# Patient Record
Sex: Female | Born: 1985 | Race: White | Hispanic: No | Smoking: Current every day smoker
Health system: Southern US, Community
[De-identification: ages and names within clinical notes are randomized; demographics above are authoritative.]

## PROBLEM LIST (undated history)

## (undated) DIAGNOSIS — Z789 Other specified health status: Secondary | ICD-10-CM

## (undated) DIAGNOSIS — F319 Bipolar disorder, unspecified: Secondary | ICD-10-CM

## (undated) DIAGNOSIS — F4310 Post-traumatic stress disorder, unspecified: Secondary | ICD-10-CM

## (undated) DIAGNOSIS — R011 Cardiac murmur, unspecified: Secondary | ICD-10-CM

## (undated) DIAGNOSIS — M1990 Unspecified osteoarthritis, unspecified site: Secondary | ICD-10-CM

## (undated) DIAGNOSIS — F419 Anxiety disorder, unspecified: Secondary | ICD-10-CM

## (undated) HISTORY — PX: NO PAST SURGERIES: SHX2092

## (undated) HISTORY — PX: OTHER SURGICAL HISTORY: SHX169

## (undated) HISTORY — DX: Bipolar disorder, unspecified: F31.9

## (undated) HISTORY — DX: Anxiety disorder, unspecified: F41.9

## (undated) HISTORY — PX: LEEP: SHX91

---
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
## 2008-12-24 HISTORY — PX: LEEP: SHX91

## 2010-08-09 ENCOUNTER — Inpatient Hospital Stay (HOSPITAL_COMMUNITY): Admission: RE | Admit: 2010-08-09 | Discharge: 2010-08-11 | Payer: Self-pay | Admitting: Obstetrics and Gynecology

## 2010-12-20 ENCOUNTER — Emergency Department (HOSPITAL_COMMUNITY)
Admission: EM | Admit: 2010-12-20 | Discharge: 2010-12-20 | Payer: Self-pay | Source: Home / Self Care | Admitting: Emergency Medicine

## 2011-03-05 LAB — RAPID STREP SCREEN (MED CTR MEBANE ONLY): Streptococcus, Group A Screen (Direct): NEGATIVE

## 2011-03-09 LAB — CBC
HCT: 39.5 % (ref 36.0–46.0)
Hemoglobin: 13.4 g/dL (ref 12.0–15.0)
MCH: 32.6 pg (ref 26.0–34.0)
MCHC: 34.1 g/dL (ref 30.0–36.0)
MCHC: 34.3 g/dL (ref 30.0–36.0)
MCV: 94.3 fL (ref 78.0–100.0)
MCV: 95.1 fL (ref 78.0–100.0)
RBC: 3.92 MIL/uL (ref 3.87–5.11)
RDW: 13 % (ref 11.5–15.5)
WBC: 12.8 10*3/uL — ABNORMAL HIGH (ref 4.0–10.5)
WBC: 19.7 10*3/uL — ABNORMAL HIGH (ref 4.0–10.5)

## 2013-06-23 LAB — OB RESULTS CONSOLE GC/CHLAMYDIA
CHLAMYDIA, DNA PROBE: NEGATIVE
Gonorrhea: NEGATIVE

## 2013-07-23 LAB — OB RESULTS CONSOLE RUBELLA ANTIBODY, IGM: RUBELLA: IMMUNE

## 2013-07-23 LAB — OB RESULTS CONSOLE RPR: RPR: NONREACTIVE

## 2013-07-23 LAB — OB RESULTS CONSOLE HIV ANTIBODY (ROUTINE TESTING): HIV: NONREACTIVE

## 2013-07-23 LAB — OB RESULTS CONSOLE HEPATITIS B SURFACE ANTIGEN: Hepatitis B Surface Ag: NEGATIVE

## 2013-07-23 LAB — OB RESULTS CONSOLE ABO/RH: RH TYPE: POSITIVE

## 2013-07-23 LAB — OB RESULTS CONSOLE ANTIBODY SCREEN: Antibody Screen: NEGATIVE

## 2013-10-26 ENCOUNTER — Emergency Department (HOSPITAL_COMMUNITY)
Admission: EM | Admit: 2013-10-26 | Discharge: 2013-10-26 | Disposition: A | Discharge status: Home / Self Care | Payer: PRIVATE HEALTH INSURANCE | Attending: Emergency Medicine | Admitting: Emergency Medicine

## 2013-10-26 ENCOUNTER — Encounter (HOSPITAL_COMMUNITY): Payer: Self-pay | Admitting: Emergency Medicine

## 2013-10-26 DIAGNOSIS — M26609 Unspecified temporomandibular joint disorder, unspecified side: Secondary | ICD-10-CM | POA: Insufficient documentation

## 2013-10-26 DIAGNOSIS — M26629 Arthralgia of temporomandibular joint, unspecified side: Secondary | ICD-10-CM

## 2013-10-26 DIAGNOSIS — Z331 Pregnant state, incidental: Secondary | ICD-10-CM | POA: Insufficient documentation

## 2013-10-26 DIAGNOSIS — O99330 Smoking (tobacco) complicating pregnancy, unspecified trimester: Secondary | ICD-10-CM | POA: Insufficient documentation

## 2013-10-26 MED ORDER — CYCLOBENZAPRINE HCL 10 MG PO TABS: 10.0000 mg | ORAL_TABLET | Freq: Three times a day (TID) | ORAL | Status: DC | PRN

## 2013-10-26 MED ORDER — HYDROCODONE-ACETAMINOPHEN 5-325 MG PO TABS: 1.0000 | ORAL_TABLET | Freq: Four times a day (QID) | ORAL | Status: DC | PRN

## 2013-10-26 NOTE — ED Notes (Signed)
Pt presents with c/o jaw pain on the right side  Pt states the actual jaw joint hurts  Pt states pain started about 2300  Pt states she was watching TV when it started

## 2013-10-26 NOTE — ED Provider Notes (Signed)
CSN: [id number]     Arrival date & time 10/26/13  0301 History   First MD Initiated Contact with Patient 10/26/13 0319     Chief Complaint  Patient presents with  . Jaw Pain   (Consider location/radiation/quality/duration/timing/severity/associated sxs/prior Treatment) HPI This is a 27 year old female who is [redacted] weeks pregnant. She is here with right TMJ pain that began about 11 PM yesterday evening. She describes it as feeling like an ice being jammed into her face. She has taken Tylenol #3 and used Orajel without relief. She has no history of TMJ dysfunction. The pain is severe enough to have her in tears.  History reviewed. No pertinent past medical history. History reviewed. No pertinent past surgical history. Family History  Problem Relation Age of Onset  . CAD Mother   . Cancer Other   . Hypertension Other   . Diabetes Other    History  Substance Use Topics  . Smoking status: Current Every Day Smoker  . Smokeless tobacco: Not on file  . Alcohol Use: No   OB History   Grav Para Term Preterm Abortions TAB SAB Ect Mult Living   2              Review of Systems  All other systems reviewed and are negative.    Allergies  Review of patient's allergies indicates no known allergies.  Home Medications  No current outpatient prescriptions on file. BP 137/91  Pulse 98  Temp(Src) 98.6 F (37 C) (Oral)  Resp 18  Ht 5\' 3"  (1.6 m)  Wt 137 lb (62.143 kg)  BMI 24.27 kg/m2  SpO2 98%  LMP 03/29/2013  Physical Exam General: Well-developed, well-nourished female in no acute distress; appearance consistent with age of record HENT: normocephalic; atraumatic; tender right temporomandibular joint with pain on movement of jaw; carious right upper and lower second molar is without tenderness to percussion Eyes: Normal appearance Neck: supple Heart: regular rate and rhythm Lungs: Normal respiratory effort and excursion Abdomen: Gravid Extremities: No deformity; full range of  motion Neurologic: Awake, alert and oriented; motor function intact in all extremities and symmetric; no facial droop Skin: Warm and dry Psychiatric: Tearful    ED Course  Procedures (including critical care time)    MDM  The patient was advised that any drug use in pregnancy is discouraged. We will prescribe a short course of hydrocodone and Flexeril; Flexeril is pregnancy category B. She was advised to followup with the dentist on call if she has no dentist. In the meantime she was advised to consider an over-the-counter bite guard at night.    Hanley Seamen, MD 10/26/13 [phone]

## 2013-11-24 DIAGNOSIS — F419 Anxiety disorder, unspecified: Secondary | ICD-10-CM | POA: Insufficient documentation

## 2013-12-24 NOTE — L&D Delivery Note (Signed)
Patient was C/C/+2 and pushed for 5 minutes with epidural.   NSVD  female infant, Apgars 9,9, weight P.   The patient had one midline second degree episiotomy repaired with 2-0 vicryl R. Fundus was firm. EBL was expected. Placenta was delivered intact. Vagina was clear.  Baby was vigorous and doing skin to skin with mother.  Veronica [name]

## 2014-01-19 LAB — OB RESULTS CONSOLE GBS: GBS: NEGATIVE

## 2014-02-06 ENCOUNTER — Encounter (HOSPITAL_COMMUNITY): Payer: PRIVATE HEALTH INSURANCE | Admitting: Anesthesiology

## 2014-02-06 ENCOUNTER — Inpatient Hospital Stay (HOSPITAL_COMMUNITY): Payer: PRIVATE HEALTH INSURANCE | Admitting: Anesthesiology

## 2014-02-06 ENCOUNTER — Inpatient Hospital Stay (HOSPITAL_COMMUNITY)
Admission: AD | Admit: 2014-02-06 | Discharge: 2014-02-08 | DRG: 775 | Disposition: A | Discharge status: Home / Self Care | Payer: PRIVATE HEALTH INSURANCE | Source: Ambulatory Visit | Attending: Obstetrics and Gynecology | Admitting: Obstetrics and Gynecology

## 2014-02-06 ENCOUNTER — Encounter (HOSPITAL_COMMUNITY): Payer: Self-pay | Admitting: *Deleted

## 2014-02-06 DIAGNOSIS — O4290 Premature rupture of membranes, unspecified as to length of time between rupture and onset of labor, unspecified weeks of gestation: Principal | ICD-10-CM | POA: Diagnosis present

## 2014-02-06 DIAGNOSIS — O99334 Smoking (tobacco) complicating childbirth: Secondary | ICD-10-CM | POA: Diagnosis present

## 2014-02-06 LAB — CBC
HEMATOCRIT: 34.3 % — AB (ref 36.0–46.0)
Hemoglobin: 11.5 g/dL — ABNORMAL LOW (ref 12.0–15.0)
MCH: 28.6 pg (ref 26.0–34.0)
MCHC: 33.5 g/dL (ref 30.0–36.0)
MCV: 85.3 fL (ref 78.0–100.0)
Platelets: 230 10*3/uL (ref 150–400)
RBC: 4.02 MIL/uL (ref 3.87–5.11)
RDW: 14.2 % (ref 11.5–15.5)
WBC: 14.2 10*3/uL — ABNORMAL HIGH (ref 4.0–10.5)

## 2014-02-06 LAB — RPR: RPR Ser Ql: NONREACTIVE

## 2014-02-06 LAB — POCT FERN TEST: POCT Fern Test: POSITIVE

## 2014-02-06 MED ORDER — LACTATED RINGERS IV SOLN: 500.0000 mL | Freq: Once | INTRAVENOUS | Status: DC

## 2014-02-06 MED ORDER — FENTANYL 2.5 MCG/ML BUPIVACAINE 1/10 % EPIDURAL INFUSION (WH - ANES)
INTRAMUSCULAR | Status: AC
  Administered 2014-02-06: 14 mL/h via EPIDURAL
  Filled 2014-02-06: qty 125

## 2014-02-06 MED ORDER — ONDANSETRON HCL 4 MG/2ML IJ SOLN
4.0000 mg | Freq: Four times a day (QID) | INTRAMUSCULAR | Status: DC | PRN
  Administered 2014-02-06: 4 mg via INTRAVENOUS
  Filled 2014-02-06: qty 2

## 2014-02-06 MED ORDER — EPHEDRINE 5 MG/ML INJ
INTRAVENOUS | Status: AC
  Filled 2014-02-06: qty 4

## 2014-02-06 MED ORDER — LACTATED RINGERS IV SOLN
INTRAVENOUS | Status: DC
  Administered 2014-02-06: 12:00:00 via INTRAVENOUS

## 2014-02-06 MED ORDER — IBUPROFEN 600 MG PO TABS
600.0000 mg | ORAL_TABLET | Freq: Four times a day (QID) | ORAL | Status: DC | PRN
  Administered 2014-02-06: 600 mg via ORAL
  Filled 2014-02-06: qty 1

## 2014-02-06 MED ORDER — ONDANSETRON HCL 4 MG PO TABS: 4.0000 mg | ORAL_TABLET | ORAL | Status: DC | PRN

## 2014-02-06 MED ORDER — LACTATED RINGERS IV SOLN
500.0000 mL | INTRAVENOUS | Status: DC | PRN
Start: 2014-02-06 — End: 2014-02-06

## 2014-02-06 MED ORDER — EPHEDRINE 5 MG/ML INJ
10.0000 mg | INTRAVENOUS | Status: DC | PRN
  Filled 2014-02-06: qty 2

## 2014-02-06 MED ORDER — SODIUM CHLORIDE 0.9 % IV SOLN: 250.0000 mL | INTRAVENOUS | Status: DC | PRN

## 2014-02-06 MED ORDER — SODIUM CHLORIDE 0.9 % IJ SOLN
3.0000 mL | Freq: Two times a day (BID) | INTRAMUSCULAR | Status: DC
  Administered 2014-02-07 (×2): 3 mL via INTRAVENOUS

## 2014-02-06 MED ORDER — OXYTOCIN BOLUS FROM INFUSION
500.0000 mL | INTRAVENOUS | Status: DC
Start: 2014-02-06 — End: 2014-02-06

## 2014-02-06 MED ORDER — LANOLIN HYDROUS EX OINT: TOPICAL_OINTMENT | CUTANEOUS | Status: DC | PRN

## 2014-02-06 MED ORDER — PHENYLEPHRINE 40 MCG/ML (10ML) SYRINGE FOR IV PUSH (FOR BLOOD PRESSURE SUPPORT)
80.0000 ug | PREFILLED_SYRINGE | INTRAVENOUS | Status: DC | PRN
  Filled 2014-02-06: qty 2

## 2014-02-06 MED ORDER — LIDOCAINE HCL (PF) 1 % IJ SOLN
30.0000 mL | INTRAMUSCULAR | Status: DC | PRN
  Filled 2014-02-06: qty 30

## 2014-02-06 MED ORDER — NICOTINE 21 MG/24HR TD PT24
21.0000 mg | MEDICATED_PATCH | Freq: Every day | TRANSDERMAL | Status: DC
Start: 2014-02-06 — End: 2014-02-06
  Administered 2014-02-06: 21 mg via TRANSDERMAL
  Filled 2014-02-06 (×2): qty 1

## 2014-02-06 MED ORDER — SIMETHICONE 80 MG PO CHEW: 80.0000 mg | CHEWABLE_TABLET | ORAL | Status: DC | PRN

## 2014-02-06 MED ORDER — ONDANSETRON HCL 4 MG/2ML IJ SOLN: 4.0000 mg | INTRAMUSCULAR | Status: DC | PRN

## 2014-02-06 MED ORDER — BENZOCAINE-MENTHOL 20-0.5 % EX AERO
1.0000 "application " | INHALATION_SPRAY | CUTANEOUS | Status: DC | PRN
  Filled 2014-02-06: qty 56

## 2014-02-06 MED ORDER — OXYTOCIN 40 UNITS IN LACTATED RINGERS INFUSION - SIMPLE MED: 62.5000 mL/h | INTRAVENOUS | Status: DC

## 2014-02-06 MED ORDER — METHYLERGONOVINE MALEATE 0.2 MG/ML IJ SOLN: 0.2000 mg | INTRAMUSCULAR | Status: DC | PRN

## 2014-02-06 MED ORDER — WITCH HAZEL-GLYCERIN EX PADS: 1.0000 "application " | MEDICATED_PAD | CUTANEOUS | Status: DC | PRN

## 2014-02-06 MED ORDER — ACETAMINOPHEN 325 MG PO TABS: 650.0000 mg | ORAL_TABLET | ORAL | Status: DC | PRN

## 2014-02-06 MED ORDER — IBUPROFEN 800 MG PO TABS
800.0000 mg | ORAL_TABLET | Freq: Three times a day (TID) | ORAL | Status: DC
  Administered 2014-02-07 – 2014-02-08 (×4): 800 mg via ORAL
  Filled 2014-02-06 (×4): qty 1

## 2014-02-06 MED ORDER — FLEET ENEMA 7-19 GM/118ML RE ENEM: 1.0000 | ENEMA | RECTAL | Status: DC | PRN

## 2014-02-06 MED ORDER — TERBUTALINE SULFATE 1 MG/ML IJ SOLN: 0.2500 mg | Freq: Once | INTRAMUSCULAR | Status: DC | PRN

## 2014-02-06 MED ORDER — FENTANYL 2.5 MCG/ML BUPIVACAINE 1/10 % EPIDURAL INFUSION (WH - ANES): 14.0000 mL/h | INTRAMUSCULAR | Status: DC | PRN

## 2014-02-06 MED ORDER — OXYCODONE-ACETAMINOPHEN 5-325 MG PO TABS: 1.0000 | ORAL_TABLET | ORAL | Status: DC | PRN

## 2014-02-06 MED ORDER — DIBUCAINE 1 % RE OINT
1.0000 "application " | TOPICAL_OINTMENT | RECTAL | Status: DC | PRN
  Filled 2014-02-06: qty 28

## 2014-02-06 MED ORDER — FERROUS SULFATE 325 (65 FE) MG PO TABS
325.0000 mg | ORAL_TABLET | Freq: Two times a day (BID) | ORAL | Status: DC
  Administered 2014-02-07 – 2014-02-08 (×3): 325 mg via ORAL
  Filled 2014-02-06 (×3): qty 1

## 2014-02-06 MED ORDER — MEASLES, MUMPS & RUBELLA VAC ~~LOC~~ INJ
0.5000 mL | INJECTION | Freq: Once | SUBCUTANEOUS | Status: DC
  Filled 2014-02-06: qty 0.5

## 2014-02-06 MED ORDER — METHYLERGONOVINE MALEATE 0.2 MG PO TABS: 0.2000 mg | ORAL_TABLET | ORAL | Status: DC | PRN

## 2014-02-06 MED ORDER — CITRIC ACID-SODIUM CITRATE 334-500 MG/5ML PO SOLN: 30.0000 mL | ORAL | Status: DC | PRN

## 2014-02-06 MED ORDER — LACTATED RINGERS IV SOLN: INTRAVENOUS | Status: DC

## 2014-02-06 MED ORDER — ZOLPIDEM TARTRATE 5 MG PO TABS: 5.0000 mg | ORAL_TABLET | Freq: Every evening | ORAL | Status: DC | PRN

## 2014-02-06 MED ORDER — OXYTOCIN 40 UNITS IN LACTATED RINGERS INFUSION - SIMPLE MED
1.0000 m[IU]/min | INTRAVENOUS | Status: DC
  Administered 2014-02-06: 2 m[IU]/min via INTRAVENOUS
  Filled 2014-02-06: qty 1000

## 2014-02-06 MED ORDER — LIDOCAINE HCL (PF) 1 % IJ SOLN
INTRAMUSCULAR | Status: DC | PRN
  Administered 2014-02-06 (×4): 4 mL

## 2014-02-06 MED ORDER — DIPHENHYDRAMINE HCL 25 MG PO CAPS: 25.0000 mg | ORAL_CAPSULE | Freq: Four times a day (QID) | ORAL | Status: DC | PRN

## 2014-02-06 MED ORDER — TETANUS-DIPHTH-ACELL PERTUSSIS 5-2.5-18.5 LF-MCG/0.5 IM SUSP: 0.5000 mL | Freq: Once | INTRAMUSCULAR | Status: DC

## 2014-02-06 MED ORDER — MAGNESIUM HYDROXIDE 400 MG/5ML PO SUSP: 30.0000 mL | ORAL | Status: DC | PRN

## 2014-02-06 MED ORDER — SENNOSIDES-DOCUSATE SODIUM 8.6-50 MG PO TABS
2.0000 | ORAL_TABLET | ORAL | Status: DC
  Filled 2014-02-06: qty 2

## 2014-02-06 MED ORDER — DIPHENHYDRAMINE HCL 50 MG/ML IJ SOLN: 12.5000 mg | INTRAMUSCULAR | Status: DC | PRN

## 2014-02-06 MED ORDER — PRENATAL MULTIVITAMIN CH
1.0000 | ORAL_TABLET | Freq: Every day | ORAL | Status: DC
  Administered 2014-02-07: 1 via ORAL
  Filled 2014-02-06: qty 1

## 2014-02-06 MED ORDER — PHENYLEPHRINE 40 MCG/ML (10ML) SYRINGE FOR IV PUSH (FOR BLOOD PRESSURE SUPPORT)
PREFILLED_SYRINGE | INTRAVENOUS | Status: AC
  Filled 2014-02-06: qty 10

## 2014-02-06 MED ORDER — SERTRALINE HCL 50 MG PO TABS
50.0000 mg | ORAL_TABLET | Freq: Every day | ORAL | Status: DC
  Administered 2014-02-07 – 2014-02-08 (×2): 50 mg via ORAL
  Filled 2014-02-06 (×2): qty 1

## 2014-02-06 MED ORDER — SODIUM CHLORIDE 0.9 % IJ SOLN: 3.0000 mL | INTRAMUSCULAR | Status: DC | PRN

## 2014-02-06 NOTE — H&P (Signed)
28 y.o. 5823w0d  G2P1001 comes in c/o SROM at 1100 this am.  Otherwise has good fetal movement and no bleeding.  History reviewed. No pertinent past medical history. History reviewed. No pertinent past surgical history.  OB History  Gravida Para Term Preterm AB SAB TAB Ectopic Multiple Living  2 1 1       1     # Outcome Date GA Lbr Len/2nd Weight Sex Delivery Anes PTL Lv  2 CUR           1 TRM               History   Social History  . Marital Status: Married    Spouse Name: N/A    Number of Children: N/A  . Years of Education: N/A   Occupational History  . Not on file.   Social History Main Topics  . Smoking status: Current Every Day Smoker  . Smokeless tobacco: Not on file  . Alcohol Use: No  . Drug Use: No  . Sexual Activity: Not on file   Other Topics Concern  . Not on file   Social History Narrative  . No narrative on file   Review of patient's allergies indicates no known allergies.    Prenatal Transfer Tool  Maternal Diabetes: No Genetic Screening: Normal Maternal Ultrasounds/Referrals: Normal Fetal Ultrasounds or other Referrals:  None Maternal Substance Abuse:  No Significant Maternal Medications:  Meds include: Zoloft Significant Maternal Lab Results: None  Other PNC: uncomplicated.    Filed Vitals:   02/06/14 1856  BP: 124/77  Pulse: 86  Temp:      Lungs/Cor:  NAD Abdomen:  soft, gravid Ex:  no cords, erythema SVE:  5/C/-2, fluid clear, IUPC placed. FHTs:  120s, good STV, NST R Toco:  q3-5   A/P   Term PROM- pit started earlier.  IUPC placed.  GBS neg.  Arneda Sappington A

## 2014-02-06 NOTE — Anesthesia Procedure Notes (Signed)
Epidural Patient location during procedure: OB Start time: 02/06/2014 6:23 PM  Staffing Performed by: anesthesiologist   Preanesthetic Checklist Completed: patient identified, site marked, surgical consent, pre-op evaluation, timeout performed, IV checked, risks and benefits discussed and monitors and equipment checked  Epidural Patient position: sitting Prep: site prepped and draped and DuraPrep Patient monitoring: continuous pulse ox and blood pressure Approach: midline Injection technique: LOR air  Needle:  Needle type: Tuohy  Needle gauge: 17 G Needle length: 9 cm and 9 Needle insertion depth: 5 cm cm Catheter type: closed end flexible Catheter size: 19 Gauge Catheter at skin depth: 10 cm Test dose: negative  Assessment Events: blood not aspirated, injection not painful, no injection resistance, negative IV test and no paresthesia  Additional Notes Discussed risk of headache, infection, bleeding, nerve injury and failed or incomplete block.  Patient voices understanding and wishes to proceed.  Epidural placed easily on first attempt.  No paresthesia.  Patient tolerated procedure well with no apparent complications.  A> Veronica [name]< MDReason for block:procedure for pain

## 2014-02-06 NOTE — Anesthesia Preprocedure Evaluation (Signed)
Anesthesia Evaluation  Patient identified by MRN, date of birth, ID band Patient awake    Reviewed: Allergy & Precautions, H&P , NPO status , Patient's Chart, lab work & pertinent test results, reviewed documented beta blocker date and time   History of Anesthesia Complications Negative for: history of anesthetic complications  Airway Mallampati: I TM Distance: >3 FB Neck ROM: full    Dental  (+) Teeth Intact   Pulmonary neg pulmonary ROS, Current Smoker,  breath sounds clear to auscultation        Cardiovascular negative cardio ROS  Rhythm:regular Rate:Normal     Neuro/Psych negative neurological ROS  negative psych ROS   GI/Hepatic negative GI ROS, Neg liver ROS,   Endo/Other  negative endocrine ROS  Renal/GU negative Renal ROS     Musculoskeletal   Abdominal   Peds  Hematology negative hematology ROS (+)   Anesthesia Other Findings   Reproductive/Obstetrics (+) Pregnancy                           Anesthesia Physical Anesthesia Plan  ASA: II  Anesthesia Plan: Epidural   Post-op Pain Management:    Induction:   Airway Management Planned:   Additional Equipment:   Intra-op Plan:   Post-operative Plan:   Informed Consent: I have reviewed the patients History and Physical, chart, labs and discussed the procedure including the risks, benefits and alternatives for the proposed anesthesia with the patient or authorized representative who has indicated his/her understanding and acceptance.     Plan Discussed with:   Anesthesia Plan Comments:         Anesthesia Quick Evaluation

## 2014-02-07 LAB — CBC
HEMATOCRIT: 33 % — AB (ref 36.0–46.0)
HEMOGLOBIN: 10.9 g/dL — AB (ref 12.0–15.0)
MCH: 28.3 pg (ref 26.0–34.0)
MCHC: 33 g/dL (ref 30.0–36.0)
MCV: 85.7 fL (ref 78.0–100.0)
Platelets: 209 10*3/uL (ref 150–400)
RBC: 3.85 MIL/uL — AB (ref 3.87–5.11)
RDW: 14.3 % (ref 11.5–15.5)
WBC: 19.1 10*3/uL — ABNORMAL HIGH (ref 4.0–10.5)

## 2014-02-07 LAB — ABO/RH: ABO/RH(D): O POS

## 2014-02-07 NOTE — Anesthesia Postprocedure Evaluation (Signed)
  Anesthesia Post-op Note  Anesthesia Post Note  Patient: Veronica [name]  Procedure(s) Performed: * No procedures listed *  Anesthesia type: Epidural  Patient location: Mother/Baby  Post pain: Pain level controlled  Post assessment: Post-op Vital signs reviewed  Last Vitals:  Filed Vitals:   02/07/14 1220  BP: 122/84  Pulse: 83  Temp: 36.7 C  Resp: 18    Post vital signs: Reviewed  Level of consciousness:alert  Complications: No apparent anesthesia complications

## 2014-02-07 NOTE — Progress Notes (Signed)
Clinical Social Work Department PSYCHOSOCIAL ASSESSMENT - MATERNAL/CHILD 02/07/2014  Patient:  Veronica [name],Veronica [name]  Account Number:  [id number]  Admit Date:  02/06/2014  Childs Name:   Veronica [name]    Clinical Social Worker:  Eluterio Seymour, LCSW   Date/Time:  02/07/2014 01:30 PM  Date Referred:  02/06/2014   Referral source  Central Nursery     Referred reason  Depression/Anxiety   Other referral source:    I:  FAMILY / HOME ENVIRONMENT Child's legal guardian:  PARENT  Guardian - Name Guardian - Age Guardian - Address  Veronica [name],Veronica [name] [address]  [place],  [address]  Veronica [name], Veronica [name]  same as above   Other household support members/support persons Other support:    II  PSYCHOSOCIAL DATA Information Source:    Financial and Community Resources Employment:   Both parents employed   Financial resources:  Private Insurance If Medicaid - County:    School / Grade:   Maternity Care Coordinator / Child Services Coordination / Early Interventions:  Cultural issues impacting care:    III  STRENGTHS Strengths  Supportive family/friends  Home prepared for Child (including basic supplies)  Adequate Resources   Strength comment:    IV  RISK FACTORS AND CURRENT PROBLEMS Current Problem:       V  SOCIAL WORK ASSESSMENT Met with mother who was pleasant and receptive to social work intervention.  She is married and have one other dependent age 3.  Spouse and maternal great grandmother were present during CSW visit.     Both parents are employed.  Mother reports hx of depression and states that she is currently taking zoloft.     She denies any current symptoms of depression.  Discussed signs/symptoms of PP depression.  Mother was very receptive to the information. She denies any hx of substance abuse.  Mother reports extensive family support.   No acute social concerns noted or reported at this time.   Informed her of social work availability.      VI SOCIAL WORK  PLAN Social Work Plan  No Further Intervention Required / No Barriers to Discharge    

## 2014-02-07 NOTE — Progress Notes (Addendum)
Patient is eating, ambulating, voiding.  Pain control is good.  Filed Vitals:   02/06/14 2231 02/06/14 2315 02/07/14 0045 02/07/14 0500  BP: 128/68 118/82 117/76 115/83  Pulse: 92 91 91 91  Temp:  98.1 F (36.7 C) 98 F (36.7 C) 98.1 F (36.7 C)  TempSrc:  Oral Oral Oral  Resp: 18 18 18 16   Height:      Weight:      SpO2:  96% 98% 99%    Fundus firm Perineum without swelling.  Lab Results  Component Value Date   WBC 19.1* 02/07/2014   HGB 10.9* 02/07/2014   HCT 33.0* 02/07/2014   MCV 85.7 02/07/2014   PLT 209 02/07/2014    --/--/O POS (02/14 1215)/RI  A/P Post partum day 1.  Routine care.  Expect d/c routine.   Parents desires circumsision.  All risks, benefits and alternatives discussed with the mother.  Veronica [name] A

## 2014-02-07 NOTE — Lactation Note (Signed)
This note was copied from the chart of Veronica Jannelly Minium. Lactation Consultation Note Initial visit at 16 hours of age.  Mom reports a few good feedings, no documented latch scores yet.  [facility] given and discussed.  Breast assessment done with erect nipples, mom is able to hand express colostrum.  Discussed importance of exculsive breastfeeding to establish milk supply.  Mom reports she hand pumped some and bottle fed to baby, because baby is sleepy after circumcision today (less than16 hours of age).  Discussed feeding cues and STS.  Mom to call for assist as needed.   Patient Name: Veronica Creig HinesChristain [name] NWGNF'AToday's Date: 02/07/2014 Reason for consult: Initial assessment   Maternal Data    Feeding    LATCH Score/Interventions          Comfort (Breast/Nipple): Soft / non-tender     Intervention(s): Breastfeeding basics reviewed     Lactation Tools Discussed/Used Pump Review: Setup, frequency, and cleaning Initiated by:: RN Date initiated:: 02/07/14   Consult Status Consult Status: Follow-up Date: 02/08/14 Follow-up type: In-patient    Veronica [name], Veronica [name] 02/07/2014, 2:05 PM

## 2014-02-08 NOTE — Lactation Note (Signed)
This note was copied from the chart of Veronica [name]. Lactation Consultation Note Follow up consult:  Baby Veronica 4035 hours old and being discharged.  Mother hand expressed good flow of colostrum.  Mother placed baby in football hold STS, baby latched easily, rhythmical sucking, some with stimulation, swallows observed.  Reviewed how to bring baby to breast (not breast to baby) and how to check for deep wide latch.  Mother has abrasions on tip of nipples, using comfort gels.  Reviewed engorgement care and mastitis. Encouraged mother to call if further assistance is needed.   Patient Name: Veronica Creig HinesChristain Manka MWUXL'KToday's Date: 02/08/2014 Reason for consult: Follow-up assessment   Maternal Data    Feeding Feeding Type: Breast Fed  LATCH Score/Interventions Latch: Grasps breast easily, tongue down, lips flanged, rhythmical sucking.  Audible Swallowing: A few with stimulation Intervention(s): Skin to skin Intervention(s): Hand expression  Type of Nipple: Everted at rest and after stimulation  Comfort (Breast/Nipple): Filling, red/small blisters or bruises, mild/mod discomfort  Problem noted: Mild/Moderate discomfort Interventions (Mild/moderate discomfort): Comfort gels  Hold (Positioning): No assistance needed to correctly position infant at breast.  LATCH Score: 8  Lactation Tools Discussed/Used     Consult Status Consult Status: Complete    Hardie PulleyBerkelhammer, Ruth Boschen 02/08/2014, 8:55 AM

## 2014-02-08 NOTE — Discharge Summary (Signed)
Obstetric Discharge Summary Reason for Admission: rupture of membranes Prenatal Procedures: ultrasound Intrapartum Procedures: spontaneous vaginal delivery and episiotomy 2nd degree midline Postpartum Procedures: none Complications-Operative and Postpartum: none Hemoglobin  Date Value Ref Range Status  02/07/2014 10.9* 12.0 - 15.0 g/dL Final     HCT  Date Value Ref Range Status  02/07/2014 33.0* 36.0 - 46.0 % Final    Physical Exam:  General: alert and cooperative Lochia: appropriate Uterine Fundus: firm DVT Evaluation: No evidence of DVT seen on physical exam.  Discharge Diagnoses: Term Pregnancy-delivered  Discharge Information: Date: 02/08/2014 Activity: pelvic rest Diet: routine Medications: PNV and Ibuprofen Condition: stable Instructions: refer to practice specific booklet Discharge to: home Follow-up Information   Follow up with HORVATH,MICHELLE A, MD In 4 weeks.   Specialty:  Obstetrics and Gynecology   Contact information:   [address] [place] [id number] [phone]       Newborn Data: Live born female  Birth Weight: 6 lb 12 oz (3062 g) APGAR: 9, 9  Home with mother.  Philip AspenCALLAHAN, Sherrin Stahle 02/08/2014, 9:21 AM

## 2014-02-11 ENCOUNTER — Encounter (HOSPITAL_COMMUNITY): Payer: Self-pay | Admitting: *Deleted

## 2014-02-11 ENCOUNTER — Inpatient Hospital Stay (HOSPITAL_COMMUNITY)
Admission: AD | Admit: 2014-02-11 | Discharge: 2014-02-11 | Disposition: A | Discharge status: Home / Self Care | Payer: PRIVATE HEALTH INSURANCE | Source: Ambulatory Visit | Attending: Obstetrics and Gynecology | Admitting: Obstetrics and Gynecology

## 2014-02-11 DIAGNOSIS — O99893 Other specified diseases and conditions complicating puerperium: Secondary | ICD-10-CM | POA: Insufficient documentation

## 2014-02-11 DIAGNOSIS — O864 Pyrexia of unknown origin following delivery: Secondary | ICD-10-CM | POA: Insufficient documentation

## 2014-02-11 DIAGNOSIS — F17200 Nicotine dependence, unspecified, uncomplicated: Secondary | ICD-10-CM | POA: Insufficient documentation

## 2014-02-11 DIAGNOSIS — O9989 Other specified diseases and conditions complicating pregnancy, childbirth and the puerperium: Secondary | ICD-10-CM

## 2014-02-11 DIAGNOSIS — R52 Pain, unspecified: Secondary | ICD-10-CM | POA: Insufficient documentation

## 2014-02-11 HISTORY — DX: Other specified health status: Z78.9

## 2014-02-11 LAB — URINALYSIS, ROUTINE W REFLEX MICROSCOPIC
BILIRUBIN URINE: NEGATIVE
GLUCOSE, UA: NEGATIVE mg/dL
Ketones, ur: NEGATIVE mg/dL
NITRITE: NEGATIVE
PH: 6 (ref 5.0–8.0)
Protein, ur: NEGATIVE mg/dL
SPECIFIC GRAVITY, URINE: 1.02 (ref 1.005–1.030)
Urobilinogen, UA: 0.2 mg/dL (ref 0.0–1.0)

## 2014-02-11 LAB — URINE MICROSCOPIC-ADD ON

## 2014-02-11 LAB — CBC
HCT: 40.1 % (ref 36.0–46.0)
Hemoglobin: 13.6 g/dL (ref 12.0–15.0)
MCH: 28.7 pg (ref 26.0–34.0)
MCHC: 33.9 g/dL (ref 30.0–36.0)
MCV: 84.6 fL (ref 78.0–100.0)
Platelets: 296 10*3/uL (ref 150–400)
RBC: 4.74 MIL/uL (ref 3.87–5.11)
RDW: 14.3 % (ref 11.5–15.5)
WBC: 13.5 10*3/uL — AB (ref 4.0–10.5)

## 2014-02-11 MED ORDER — IBUPROFEN 600 MG PO TABS
600.0000 mg | ORAL_TABLET | Freq: Once | ORAL | Status: AC
  Administered 2014-02-11: 600 mg via ORAL
  Filled 2014-02-11: qty 1

## 2014-02-11 NOTE — MAU Note (Signed)
Pt reports she has been achy all day, fever 102. Took some tylenol and MD told her to come in. Vaginal delivery 02/14. Breast feeding without problems.

## 2014-02-11 NOTE — MAU Provider Note (Signed)
History     CSN: [id number]  Arrival date and time: 02/11/14 4098   First Provider Initiated Contact with Patient 02/11/14 2147      Chief Complaint  Patient presents with  . Fever  . Generalized Body Aches   HPI   Ms. Veronica [name] is a 28 y.o. female G2P2002 status post vaginal delivery on 02/06/2014: no complications. Pt developed chills a few days ago and fever today; 102. Pt took ibuprofen at 1200 noon and the fever went down to 99.0. She called the office and they told her to monitor it and she did and it went back up to 101.   Pt is breast feeding; baby is feeding every 2-3 hours, milk has come in. She has never gone more than 3 hours without feeding or pumping. She denies breast pain  She denies abdominal pain Vaginal bleeding is minimal      OB History   Grav Para Term Preterm Abortions TAB SAB Ect Mult Living   2 2 2       2       Past Medical History  Diagnosis Date  . Medical history non-contributory     Past Surgical History  Procedure Laterality Date  . No past surgeries      Family History  Problem Relation Age of Onset  . CAD Mother   . Cancer Other   . Hypertension Other   . Diabetes Other     History  Substance Use Topics  . Smoking status: Current Every Day Smoker  . Smokeless tobacco: Not on file  . Alcohol Use: No    Allergies: No Known Allergies  Prescriptions prior to admission  Medication Sig Dispense Refill  . dibucaine (NUPERCAINAL) 1 % OINT Place 1 application rectally 2 (two) times daily as needed for pain or hemorrhoids.      Marland Kitchen ibuprofen (ADVIL,MOTRIN) 200 MG tablet Take 600 mg by mouth every 6 (six) hours as needed for moderate pain.      . Prenatal Vit-Fe Fumarate-FA (PRENATAL MULTIVITAMIN) TABS tablet Take 1 tablet by mouth daily at 12 noon.      . sertraline (ZOLOFT) 50 MG tablet Take 50 mg by mouth daily.       Results for orders placed during the hospital encounter of 02/11/14 (from the past 48 hour(s))   URINALYSIS, ROUTINE W REFLEX MICROSCOPIC     Status: Abnormal   Collection Time    02/11/14  8:14 PM      Result Value Ref Range   Color, Urine YELLOW  YELLOW   APPearance CLEAR  CLEAR   Specific Gravity, Urine 1.020  1.005 - 1.030   pH 6.0  5.0 - 8.0   Glucose, UA NEGATIVE  NEGATIVE mg/dL   Hgb urine dipstick MODERATE (*) NEGATIVE   Bilirubin Urine NEGATIVE  NEGATIVE   Ketones, ur NEGATIVE  NEGATIVE mg/dL   Protein, ur NEGATIVE  NEGATIVE mg/dL   Urobilinogen, UA 0.2  0.0 - 1.0 mg/dL   Nitrite NEGATIVE  NEGATIVE   Leukocytes, UA SMALL (*) NEGATIVE  URINE MICROSCOPIC-ADD ON     Status: Abnormal   Collection Time    02/11/14  8:14 PM      Result Value Ref Range   Squamous Epithelial / LPF RARE  RARE   WBC, UA 3-6  <3 WBC/hpf   RBC / HPF 0-2  <3 RBC/hpf   Bacteria, UA FEW (*) RARE   Urine-Other MUCOUS PRESENT    CBC  Status: Abnormal   Collection Time    02/11/14  9:32 PM      Result Value Ref Range   WBC 13.5 (*) 4.0 - 10.5 K/uL   RBC 4.74  3.87 - 5.11 MIL/uL   Hemoglobin 13.6  12.0 - 15.0 g/dL   HCT 02.740.1  25.336.0 - 66.446.0 %   MCV 84.6  78.0 - 100.0 fL   MCH 28.7  26.0 - 34.0 pg   MCHC 33.9  30.0 - 36.0 g/dL   RDW 40.314.3  47.411.5 - 25.915.5 %   Platelets 296  150 - 400 K/uL    Review of Systems  Constitutional: Positive for fever and chills.  HENT: Negative for sore throat.   Genitourinary: Negative for dysuria, urgency, frequency, hematuria and flank pain.  Musculoskeletal: Positive for back pain (soreness; no pain ).  Neurological: Positive for headaches.   Physical Exam   Blood pressure 120/70, pulse 94, temperature 100.8 F (38.2 C), temperature source Oral, resp. rate 18, height 5\' 3"  (1.6 m), weight 61.689 kg (136 lb), last menstrual period 03/29/2013, SpO2 96.00%, unknown if currently breastfeeding.  Repeated BP 130/71  Physical Exam  Constitutional: She is oriented to person, place, and time. Vital signs are normal. She appears well-developed and well-nourished.   Non-toxic appearance. She does not have a sickly appearance. She does not appear ill. No distress.  HENT:  Head: Normocephalic.  Eyes: Pupils are equal, round, and reactive to light.  Neck: Neck supple.  Cardiovascular: Normal rate and normal heart sounds.   Respiratory: Effort normal and breath sounds normal. No respiratory distress. She exhibits no mass and no tenderness. Right breast exhibits no skin change and no tenderness. Left breast exhibits no skin change and no tenderness.  No erythema; no sign of mastitis   GI: Soft. Normal appearance. She exhibits no distension and no mass. There is no tenderness. There is no rebound, no guarding and no CVA tenderness.  Neurological: She is alert and oriented to person, place, and time. GCS eye subscore is 4. GCS verbal subscore is 5. GCS motor subscore is 6.  Skin: Skin is warm. She is not diaphoretic.    MAU Course  Procedures None  MDM Consulted with Dr. Claiborne Billingsallahan  CBC; white count at discharge from the hospital was 19.5 Ibuprofen 600 mg PO in MAU  Discussed fever of 102.6 with Dr. Claiborne Billingsallahan; will plan to discharge patient home and have her call the office tomorrow morning if her fever is still high.   Assessment and Plan   A:  1. Postpartum fever     P:  Discharge home Call the office tomorrow if fever persist Ok to take ibuprofen every 6 hours as needed for fever Return to MAU if symptoms worsen   Veronica HansenJennifer Irene Chaela Branscum, NP  02/11/2014, 9:47 PM

## 2014-02-11 NOTE — Discharge Instructions (Signed)
Fever, Adult °A fever is a temperature of 100.4° F (38° C) or above.  °HOME CARE °· Take fever medicine as told by your doctor. Do not  take aspirin for fever if you are younger than 26 years of age. °· If you are given antibiotic medicine, take it as told. Finish the medicine even if you start to feel better. °· Rest. °· Drink enough fluids to keep your pee (urine) clear or pale yellow. Do not drink alcohol. °· Take a bath or shower with room temperature water. Do not use ice water or alcohol sponge baths. °· Wear lightweight, loose clothes. °GET HELP RIGHT AWAY IF:  °· You are short of breath or have trouble breathing. °· You are very weak. °· You are dizzy or you pass out (faint). °· You are very thirsty or are making little or no urine. °· You have new pain. °· You throw up (vomit) or have watery poop (diarrhea). °· You keep throwing up or having watery poop for more than 1 to 2 days. °· You have a stiff neck or light bothers your eyes. °· You have a skin rash. °· You have a fever or problems (symptoms) that last for more than 2 to 3 days. °· You have a fever and your problems quickly get worse. °· You keep throwing up the fluids you drink. °· You do not feel better after 3 days. °· You have new problems. °MAKE SURE YOU:  °· Understand these instructions. °· Will watch your condition. °· Will get help right away if you are not doing well or get worse. °Document Released: 09/18/2008 Document Revised: 03/03/2012 Document Reviewed: 10/11/2011 °[employer]® Patient Information ©2014 [employer]. ° °

## 2014-02-13 LAB — URINE CULTURE
Colony Count: NO GROWTH
Culture: NO GROWTH

## 2014-02-15 NOTE — MAU Provider Note (Signed)
Discussed patient at length with Victorino DikeJennifer.  Per Victorino DikeJennifer, pt appeared quite well despite fever.  She denied any complaints but generalized body aches and fever.  White count had declined since discharge.  Urine was negative and she had no head/neckache.  Her Uterus was non-tender, no signs of cold/flu.  Her only recent issue was a bout of loose stools the prior day.  After watching for some time, and without finding a focal reason for her fever, she was discharged home with precautions and suspected viral illness.  She was told to f/u in office with new symptoms or persistent fever.

## 2014-10-25 ENCOUNTER — Encounter (HOSPITAL_COMMUNITY): Payer: Self-pay | Admitting: *Deleted

## 2016-01-31 ENCOUNTER — Encounter (HOSPITAL_COMMUNITY): Payer: Self-pay | Admitting: Cardiology

## 2016-01-31 ENCOUNTER — Emergency Department (HOSPITAL_COMMUNITY)
Admission: EM | Admit: 2016-01-31 | Discharge: 2016-01-31 | Disposition: A | Discharge status: Home / Self Care | Payer: Self-pay | Attending: Emergency Medicine | Admitting: Emergency Medicine

## 2016-01-31 ENCOUNTER — Emergency Department (HOSPITAL_COMMUNITY): Payer: PRIVATE HEALTH INSURANCE

## 2016-01-31 DIAGNOSIS — F17210 Nicotine dependence, cigarettes, uncomplicated: Secondary | ICD-10-CM | POA: Insufficient documentation

## 2016-01-31 DIAGNOSIS — R509 Fever, unspecified: Secondary | ICD-10-CM | POA: Insufficient documentation

## 2016-01-31 DIAGNOSIS — B9689 Other specified bacterial agents as the cause of diseases classified elsewhere: Secondary | ICD-10-CM

## 2016-01-31 DIAGNOSIS — Z793 Long term (current) use of hormonal contraceptives: Secondary | ICD-10-CM | POA: Insufficient documentation

## 2016-01-31 DIAGNOSIS — Z79899 Other long term (current) drug therapy: Secondary | ICD-10-CM | POA: Insufficient documentation

## 2016-01-31 DIAGNOSIS — N760 Acute vaginitis: Secondary | ICD-10-CM | POA: Insufficient documentation

## 2016-01-31 DIAGNOSIS — R0981 Nasal congestion: Secondary | ICD-10-CM | POA: Insufficient documentation

## 2016-01-31 DIAGNOSIS — Z7951 Long term (current) use of inhaled steroids: Secondary | ICD-10-CM | POA: Insufficient documentation

## 2016-01-31 DIAGNOSIS — J029 Acute pharyngitis, unspecified: Secondary | ICD-10-CM | POA: Insufficient documentation

## 2016-01-31 DIAGNOSIS — R1031 Right lower quadrant pain: Secondary | ICD-10-CM

## 2016-01-31 DIAGNOSIS — Z3202 Encounter for pregnancy test, result negative: Secondary | ICD-10-CM | POA: Insufficient documentation

## 2016-01-31 LAB — COMPREHENSIVE METABOLIC PANEL
ALT: 13 U/L — AB (ref 14–54)
ANION GAP: 8 (ref 5–15)
AST: 20 U/L (ref 15–41)
Albumin: 4.3 g/dL (ref 3.5–5.0)
Alkaline Phosphatase: 58 U/L (ref 38–126)
BUN: 5 mg/dL — ABNORMAL LOW (ref 6–20)
CHLORIDE: 104 mmol/L (ref 101–111)
CO2: 26 mmol/L (ref 22–32)
Calcium: 9.2 mg/dL (ref 8.9–10.3)
Creatinine, Ser: 0.67 mg/dL (ref 0.44–1.00)
Glucose, Bld: 108 mg/dL — ABNORMAL HIGH (ref 65–99)
Potassium: 4.1 mmol/L (ref 3.5–5.1)
SODIUM: 138 mmol/L (ref 135–145)
TOTAL PROTEIN: 6.9 g/dL (ref 6.5–8.1)
Total Bilirubin: 0.6 mg/dL (ref 0.3–1.2)

## 2016-01-31 LAB — CBC
HEMATOCRIT: 41.2 % (ref 36.0–46.0)
HEMOGLOBIN: 13.9 g/dL (ref 12.0–15.0)
MCH: 30.8 pg (ref 26.0–34.0)
MCHC: 33.7 g/dL (ref 30.0–36.0)
MCV: 91.2 fL (ref 78.0–100.0)
Platelets: 227 10*3/uL (ref 150–400)
RBC: 4.52 MIL/uL (ref 3.87–5.11)
RDW: 12.5 % (ref 11.5–15.5)
WBC: 8.2 10*3/uL (ref 4.0–10.5)

## 2016-01-31 LAB — I-STAT BETA HCG BLOOD, ED (MC, WL, AP ONLY): I-stat hCG, quantitative: <5 m[IU]/mL (ref <5)

## 2016-01-31 LAB — WET PREP, GENITAL
SPERM: NONE SEEN
TRICH WET PREP: NONE SEEN
YEAST WET PREP: NONE SEEN

## 2016-01-31 LAB — LIPASE, BLOOD: LIPASE: 26 U/L (ref 11–51)

## 2016-01-31 LAB — URINALYSIS, ROUTINE W REFLEX MICROSCOPIC
GLUCOSE, UA: NEGATIVE mg/dL
Hgb urine dipstick: NEGATIVE
KETONES UR: 15 mg/dL — AB
Leukocytes, UA: NEGATIVE
Nitrite: NEGATIVE
PH: 6.5 (ref 5.0–8.0)
Protein, ur: NEGATIVE mg/dL
Specific Gravity, Urine: 1.022 (ref 1.005–1.030)

## 2016-01-31 MED ORDER — ONDANSETRON HCL 4 MG/2ML IJ SOLN
4.0000 mg | Freq: Once | INTRAMUSCULAR | Status: AC
  Administered 2016-01-31: 4 mg via INTRAVENOUS
  Filled 2016-01-31: qty 2

## 2016-01-31 MED ORDER — METRONIDAZOLE 500 MG PO TABS: 500.0000 mg | ORAL_TABLET | Freq: Two times a day (BID) | ORAL | Status: DC

## 2016-01-31 MED ORDER — ONDANSETRON 4 MG PO TBDP: 4.0000 mg | ORAL_TABLET | Freq: Three times a day (TID) | ORAL | Status: DC | PRN

## 2016-01-31 MED ORDER — SODIUM CHLORIDE 0.9 % IV BOLUS (SEPSIS)
1000.0000 mL | Freq: Once | INTRAVENOUS | Status: AC
  Administered 2016-01-31: 1000 mL via INTRAVENOUS

## 2016-01-31 MED ORDER — IOHEXOL 300 MG/ML  SOLN
80.0000 mL | Freq: Once | INTRAMUSCULAR | Status: AC | PRN
  Administered 2016-01-31: 80 mL via INTRAVENOUS

## 2016-01-31 NOTE — ED Notes (Signed)
Patient transported to CT 

## 2016-01-31 NOTE — Discharge Instructions (Signed)
Take your medications as prescribed. Refrain from doing any vaginal douches or genital washes. Refrain from any intercourse or oral sex until you have completed your antibiotics and her symptoms have improved. You may take 800 mg ibuprofen 3 times a day as needed for pain relief. Call your gynecologist tomorrow to schedule a follow-up appointment within the next week. Return to the emergency department if symptoms worsen or new onset of fever, vomiting, bloody stool, blood in urine, vaginal bleeding, pelvic pain.

## 2016-01-31 NOTE — ED Notes (Signed)
Patient is alert and orientedx4.  Patient was explained discharge instructions and they understood them with no questions.   

## 2016-01-31 NOTE — ED Provider Notes (Signed)
CSN: [id number]     Arrival date & time 01/31/16  1305 History  By signing my name below, I, Tanda Rockers, attest that this documentation has been prepared under the direction and in the presence of Melburn Hake, PA-C. Electronically Signed: Tanda Rockers, ED Scribe. 01/31/2016. 3:16 PM.   Chief Complaint  Patient presents with  . Abdominal Pain   The history is provided by the patient. No language interpreter was used.     HPI Comments: Veronica [name] is a 30 y.o. female who presents to the Emergency Department complaining of gradual onset, constant, dull and aching, RLQ abdominal pain x 2-3 weeks, worsening and becoming sharp 6/10 pain today. The pain is worse with movement. Pt also reports having sudden onset, constant, pressure and burning, epigastric pain that began this morning while eating breakfast which she notes has improved since arrival to the ED. Pt went to UC this morning for her pain and was told to come to the ED to rule out appendicitis. Pt reports that she has been nauseous due to the pain but denies vomiting. She also complains of a nasal congestion, low grade fever of 100.3, and a sore throat that began this morning. Recent sick contact with similar symptoms. Denies chills, cough, shortness of breath, chest pain, wheezing, vomiting, diarrhea, constipation, dysuria, hematuria, vaginal bleeding, vaginal discharge, or any other associated symptoms. LNMP: 01/08/2016. Pt has nexplanon implant and reports that she usually gets periods monthly with the nexplanon. G2P2A0. No previous abdominal surgeries.   Past Medical History  Diagnosis Date  . Medical history non-contributory    Past Surgical History  Procedure Laterality Date  . No past surgeries     Family History  Problem Relation Age of Onset  . CAD Mother   . Cancer Other   . Hypertension Other   . Diabetes Other    Social History  Substance Use Topics  . Smoking status: Current Every Day Smoker    Types:  Cigarettes  . Smokeless tobacco: None  . Alcohol Use: No   OB History    Gravida Para Term Preterm AB TAB SAB Ectopic Multiple Living   2 2 2       2      Review of Systems  Constitutional: Positive for fever. Negative for chills.  HENT: Positive for congestion and sore throat.   Respiratory: Negative for cough, shortness of breath and wheezing.   Cardiovascular: Negative for chest pain.  Gastrointestinal: Positive for nausea and abdominal pain. Negative for vomiting, diarrhea and constipation.  Genitourinary: Negative for dysuria, hematuria, vaginal bleeding and vaginal discharge.   Allergies  Review of patient's allergies indicates no known allergies.  Home Medications   Prior to Admission medications   Medication Sig Start Date End Date Taking? Authorizing Provider  dibucaine (NUPERCAINAL) 1 % OINT Place 1 application rectally 2 (two) times daily as needed for pain or hemorrhoids.   Yes Historical Provider, MD  etonogestrel (NEXPLANON) 68 MG IMPL implant Inject 68 mg into the skin once. 05/04/14   Yes Historical Provider, MD  fluticasone (FLONASE) 50 MCG/ACT nasal spray Place 2 sprays into the nose daily. 12/09/15 12/08/16 Yes Historical Provider, MD  ibuprofen (ADVIL,MOTRIN) 200 MG tablet Take 600 mg by mouth every 6 (six) hours as needed for moderate pain.   Yes Historical Provider, MD  sertraline (ZOLOFT) 50 MG tablet Take 50 mg by mouth daily.   Yes Historical Provider, MD  metroNIDAZOLE (FLAGYL) 500 MG tablet Take 1 tablet (500 mg total)  by mouth 2 (two) times daily. 01/31/16   Barrett Henle, PA-C  ondansetron (ZOFRAN ODT) 4 MG disintegrating tablet Take 1 tablet (4 mg total) by mouth every 8 (eight) hours as needed for nausea or vomiting. 01/31/16   Satira Sark Gervase Colberg, PA-C   BP 116/84 mmHg  Pulse 119  Temp(Src) 99.3 F (37.4 C) (Oral)  Resp 18  Ht  (1.6 m)  Wt 52.164 kg  BMI 20.38 kg/m2  SpO2 97%  LMP 01/08/2016   Physical Exam  Constitutional: She  is oriented to person, place, and time. She appears well-developed and well-nourished. No distress.  HENT:  Head: Normocephalic and atraumatic.  Right Ear: Tympanic membrane normal.  Left Ear: Tympanic membrane normal.  Nose: Rhinorrhea present. Right sinus exhibits maxillary sinus tenderness. Right sinus exhibits no frontal sinus tenderness. Left sinus exhibits maxillary sinus tenderness. Left sinus exhibits no frontal sinus tenderness.  Mouth/Throat: Uvula is midline and mucous membranes are normal. Posterior oropharyngeal erythema present. No oropharyngeal exudate, posterior oropharyngeal edema or tonsillar abscesses.  Eyes: Conjunctivae and EOM are normal. Right eye exhibits no discharge. Left eye exhibits no discharge. No scleral icterus.  Neck: Normal range of motion. Neck supple. No tracheal deviation present.  No cervical lymphadenopathy  Cardiovascular: Normal rate, regular rhythm, normal heart sounds and intact distal pulses.   Pulmonary/Chest: Effort normal and breath sounds normal. No respiratory distress. She has no wheezes. She has no rales. She exhibits no tenderness.  Abdominal: Soft. Bowel sounds are normal. She exhibits no distension and no mass. There is tenderness. There is no rebound and no guarding.  Tenderness to RLQ, periumbilical, and epigastric region  Musculoskeletal: Normal range of motion. She exhibits no edema.  Neurological: She is alert and oriented to person, place, and time.  Skin: Skin is warm and dry.  Psychiatric: She has a normal mood and affect. Her behavior is normal.  Nursing note and vitals reviewed.   ED Course  Procedures (including critical care time)  DIAGNOSTIC STUDIES: Oxygen Saturation is 97% on RA, normal by my interpretation.    COORDINATION OF CARE: 3:11 PM-Discussed treatment plan which includes Ct A/P with pt at bedside and pt agreed to plan.   Labs Review Labs Reviewed  WET PREP, GENITAL - Abnormal; Notable for the following:     Clue Cells Wet Prep HPF POC PRESENT (*)    WBC, Wet Prep HPF POC PRESENT (*)    All other components within normal limits  COMPREHENSIVE METABOLIC PANEL - Abnormal; Notable for the following:    Glucose, Bld 108 (*)    BUN 5 (*)    ALT 13 (*)    All other components within normal limits  URINALYSIS, ROUTINE W REFLEX MICROSCOPIC (NOT AT [facility]) - Abnormal; Notable for the following:    Color, Urine AMBER (*)    Bilirubin Urine SMALL (*)    Ketones, ur 15 (*)    All other components within normal limits  LIPASE, BLOOD  CBC  I-STAT BETA HCG BLOOD, ED ([facility], [facility], [facility] ONLY)  GC/CHLAMYDIA PROBE AMP ([place]) NOT AT [facility]   Pelvic exam: normal external genitalia, vulva, vagina, cervix, uterus and adnexa, VULVA: normal appearing vulva with no masses, tenderness or lesions, VAGINA: normal appearing vagina with normal color and discharge, no lesions, vaginal discharge - clear, frothy and mucoid, WET MOUNT done - results: clue cells, white blood cells, DNA probe for chlamydia and GC obtained, CERVIX: normal appearing cervix without discharge or lesions, UTERUS: uterus is  normal size, shape, consistency and nontender, ADNEXA: normal adnexa in size, nontender and no masses, exam chaperoned by female tech.   Imaging Review Ct Abdomen Pelvis W Contrast  01/31/2016  CLINICAL DATA:  Patient with right lower quadrant abdominal pain for 3 weeks, worsening. EXAM: CT ABDOMEN AND PELVIS WITH CONTRAST TECHNIQUE: Multidetector CT imaging of the abdomen and pelvis was performed using the standard protocol following bolus administration of intravenous contrast. CONTRAST:  80mL OMNIPAQUE IOHEXOL 300 MG/ML  SOLN COMPARISON:  None. FINDINGS: Lower chest: Normal heart size. No consolidative pulmonary opacities. No pleural effusion or pneumothorax. Hepatobiliary: Liver is normal in size and contour. Fatty deposition adjacent to the falciform ligament. Within the medial left hepatic lobe there is a sub cm low-attenuation lesion,  too small to accurately characterize. Gallbladder is decompressed. No intrahepatic or extrahepatic biliary ductal dilatation. Pancreas: Unremarkable Spleen: Unremarkable Adrenals/Urinary Tract: The adrenal glands are normal. The kidneys enhance symmetrically with contrast. No hydronephrosis. Urinary bladder is unremarkable. Stomach/Bowel: No abnormal bowel wall thickening or evidence for bowel obstruction. No free fluid or free intraperitoneal air. The appendix is normal. Vascular/Lymphatic: Normal caliber abdominal aorta. No retroperitoneal lymphadenopathy. Other: Uterus and adnexal structures are unremarkable. Musculoskeletal: No aggressive or acute appearing osseous lesions. IMPRESSION: No acute process within the abdomen or pelvis. Electronically Signed   By: Annia Belt M.D.   On: 01/31/2016 16:15   I have personally reviewed and evaluated these images and lab results as part of my medical decision-making.  Filed Vitals:   01/31/16 1312 01/31/16 1824  BP: 116/84 113/64  Pulse: 119 85  Temp: 99.3 F (37.4 C) 98.4 F (36.9 C)  Resp: 18 16     MDM   Final diagnoses:  Right lower quadrant abdominal pain  BV (bacterial vaginosis)   Patient presents with right lower quadrant pain and associated nausea. She reports having subjective fever, sore throat and nasal congestion. Patient was seen at urgent care prior to arrival and was advised to come to the ED due to to concern for appendicitis. VSS. Exam revealed rhinorrhea, bilateral maxillary sinus tenderness, posterior oropharyngeal erythema, right lower quadrant, periumbilical and mild epigastric tenderness no peritoneal signs. Lungs CTAB.  Pregnancy negative. Labs and urine unremarkable. Pelvic exam revealed clear frothy mucoid discharge in the vaginal vault, no CMT, no adnexal tenderness. Swabs obtained for GC and chlamydia. Wet prep positive for clue cells. CT abdomen revealed no acute process within the abdomen or pelvis. I do not suspect PID  or other acute intra-abdominal infectious etiology at this time. Plan to discharge patient home with Flagyl for BV and discussed symptomatic treatment for viral URI symptoms. Advised patient to follow up with her gynecologist within the next week if her pain continues and discussed need for possible outpatient ultrasound for further evaluation.  Evaluation does not show pathology requring ongoing emergent intervention or admission. Pt is hemodynamically stable and mentating appropriately. Discussed findings/results and plan with patient/guardian, who agrees with plan. All questions answered. Return precautions discussed and outpatient follow up given.    I personally performed the services described in this documentation, which was scribed in my presence. The recorded information has been reviewed and is accurate.      Satira Sark [place], [place] 01/31/16 1832  Jerelyn Scott, MD 02/03/16 [phone]

## 2016-01-31 NOTE — ED Notes (Signed)
Pt reports mid/lower abd pain for about a week. Was sent here from [facility] for further work up for append.

## 2016-01-31 NOTE — ED Notes (Signed)
Family at bedside. 

## 2016-02-01 LAB — GC/CHLAMYDIA PROBE AMP (~~LOC~~) NOT AT ARMC
Chlamydia: NEGATIVE
Neisseria Gonorrhea: NEGATIVE

## 2016-02-21 ENCOUNTER — Other Ambulatory Visit: Payer: Self-pay | Admitting: Obstetrics and Gynecology

## 2016-02-23 LAB — CYTOLOGY - PAP

## 2017-02-26 ENCOUNTER — Other Ambulatory Visit: Payer: Self-pay | Admitting: Obstetrics and Gynecology

## 2017-02-28 LAB — CYTOLOGY - PAP

## 2020-08-02 LAB — HM PAP SMEAR: HM Pap smear: NEGATIVE

## 2020-08-04 LAB — HM PAP SMEAR: HM Pap smear: NEGATIVE

## 2021-10-10 ENCOUNTER — Emergency Department (HOSPITAL_COMMUNITY): Payer: Medicaid Other

## 2021-10-10 ENCOUNTER — Encounter (HOSPITAL_COMMUNITY): Payer: Self-pay

## 2021-10-10 ENCOUNTER — Other Ambulatory Visit: Payer: Self-pay

## 2021-10-10 ENCOUNTER — Emergency Department (HOSPITAL_COMMUNITY)
Admission: EM | Admit: 2021-10-10 | Discharge: 2021-10-10 | Disposition: A | Discharge status: Home / Self Care | Payer: Medicaid Other | Attending: Emergency Medicine | Admitting: Emergency Medicine

## 2021-10-10 DIAGNOSIS — D72829 Elevated white blood cell count, unspecified: Secondary | ICD-10-CM | POA: Diagnosis not present

## 2021-10-10 DIAGNOSIS — N939 Abnormal uterine and vaginal bleeding, unspecified: Secondary | ICD-10-CM | POA: Insufficient documentation

## 2021-10-10 DIAGNOSIS — F17210 Nicotine dependence, cigarettes, uncomplicated: Secondary | ICD-10-CM | POA: Insufficient documentation

## 2021-10-10 DIAGNOSIS — R102 Pelvic and perineal pain: Secondary | ICD-10-CM | POA: Diagnosis not present

## 2021-10-10 LAB — CBC WITH DIFFERENTIAL/PLATELET
Abs Immature Granulocytes: 0.05 10*3/uL (ref 0.00–0.07)
Basophils Absolute: 0.1 10*3/uL (ref 0.0–0.1)
Basophils Relative: 1 %
Eosinophils Absolute: 0.1 10*3/uL (ref 0.0–0.5)
Eosinophils Relative: 0 %
HCT: 45.2 % (ref 36.0–46.0)
Hemoglobin: 14.7 g/dL (ref 12.0–15.0)
Immature Granulocytes: 0 %
Lymphocytes Relative: 27 %
Lymphs Abs: 3.7 10*3/uL (ref 0.7–4.0)
MCH: 30.1 pg (ref 26.0–34.0)
MCHC: 32.5 g/dL (ref 30.0–36.0)
MCV: 92.4 fL (ref 80.0–100.0)
Monocytes Absolute: 0.6 10*3/uL (ref 0.1–1.0)
Monocytes Relative: 5 %
Neutro Abs: 9.3 10*3/uL — ABNORMAL HIGH (ref 1.7–7.7)
Neutrophils Relative %: 67 %
Platelets: 325 10*3/uL (ref 150–400)
RBC: 4.89 MIL/uL (ref 3.87–5.11)
RDW: 12.7 % (ref 11.5–15.5)
WBC: 13.9 10*3/uL — ABNORMAL HIGH (ref 4.0–10.5)
nRBC: 0 % (ref 0.0–0.2)

## 2021-10-10 LAB — WET PREP, GENITAL
Clue Cells Wet Prep HPF POC: NONE SEEN
Sperm: NONE SEEN
Trich, Wet Prep: NONE SEEN
Yeast Wet Prep HPF POC: NONE SEEN

## 2021-10-10 LAB — COMPREHENSIVE METABOLIC PANEL
ALT: 15 U/L (ref 0–44)
AST: 21 U/L (ref 15–41)
Albumin: 4.5 g/dL (ref 3.5–5.0)
Alkaline Phosphatase: 54 U/L (ref 38–126)
Anion gap: 9 (ref 5–15)
BUN: 6 mg/dL (ref 6–20)
CO2: 25 mmol/L (ref 22–32)
Calcium: 8.9 mg/dL (ref 8.9–10.3)
Chloride: 105 mmol/L (ref 98–111)
Creatinine, Ser: 0.73 mg/dL (ref 0.44–1.00)
GFR, Estimated: >60 mL/min (ref >60)
Glucose, Bld: 122 mg/dL — ABNORMAL HIGH (ref 70–99)
Potassium: 3.1 mmol/L — ABNORMAL LOW (ref 3.5–5.1)
Sodium: 139 mmol/L (ref 135–145)
Total Bilirubin: 0.4 mg/dL (ref 0.3–1.2)
Total Protein: 7.7 g/dL (ref 6.5–8.1)

## 2021-10-10 LAB — URINALYSIS, ROUTINE W REFLEX MICROSCOPIC
Bacteria, UA: NONE SEEN
Bilirubin Urine: NEGATIVE
Glucose, UA: NEGATIVE mg/dL
Ketones, ur: NEGATIVE mg/dL
Leukocytes,Ua: NEGATIVE
Nitrite: NEGATIVE
Protein, ur: NEGATIVE mg/dL
Specific Gravity, Urine: 1.002 — ABNORMAL LOW (ref 1.005–1.030)
pH: 7 (ref 5.0–8.0)

## 2021-10-10 LAB — I-STAT BETA HCG BLOOD, ED (MC, WL, AP ONLY): I-stat hCG, quantitative: 5 m[IU]/mL — ABNORMAL HIGH (ref <5)

## 2021-10-10 NOTE — ED Notes (Signed)
Pelvic exam ready at bedside. ?

## 2021-10-10 NOTE — Discharge Instructions (Signed)
Your physical exam and blood work were very reassuring as was your ultrasound.  Please continue with the oral contraceptive pill taper prescribed by your OB/GYN and follow-up with them closely in the outpatient setting.  Return to the ER with any persistent heavy vaginal bleeding, lightheadedness, chest pain, shortness of breath with the passout, you develop any worsening abdominal pain, or any other new severe symptoms.

## 2021-10-10 NOTE — ED Provider Notes (Signed)
[facility]-EMERGENCY DEPT Provider Note   CSN: [id number] Arrival date & time: 10/10/21  1040     History Chief Complaint  Patient presents with   Vaginal Bleeding    Veronica [name] is a 35 y.o. female.  Patient presents today with complaint of vaginal bleeding. She states that same began 10 days ago and has been constant since. She is going through 4-5 menstrual pads per day of dark red blood with clots. She was apparently clinically diagnosed by her OB/GYN with endometriosis, and placed on oral birth control for same. Started this medication at the beginning of September, this is the first menstrual cycle she has gone through since starting the new contraceptive medication. She also endorses that her cycle was a few days late. She is also having pelvic pain, however states that it feels like normal menstrual cramps. Denies nausea, vomiting, diarrhea, constipation. Last bowel movement was earlier today and normal. Is passing normal flatus. Does endorses new sexual partner over the past few months. Denies vaginal discharge, dysuria, hematuria. History of LEEP, G2P2, she is up to date on her pap smears. Patient states that she discussed bleeding with her OB/GYN who recommended birth control taper to control bleeding and then follow-up in the office, however she 'got nervous' and decided to come to the ER for further evaluation.  The history is provided by the patient. No language interpreter was used.  Vaginal Bleeding Associated symptoms: no abdominal pain, no dizziness, no fatigue, no fever and no nausea       Past Medical History:  Diagnosis Date   Medical history non-contributory     Patient Active Problem List   Diagnosis Date Noted   Indication for care in labor or delivery 02/06/2014   Postpartum state 02/06/2014    Past Surgical History:  Procedure Laterality Date   LEEP     NO PAST SURGERIES       OB History     Gravida  2   Para  2    Term  2   Preterm      AB      Living  2      SAB      IAB      Ectopic      Multiple      Live Births  1           Family History  Problem Relation Age of Onset   CAD Mother    Cancer Other    Hypertension Other    Diabetes Other     Social History   Tobacco Use   Smoking status: Every Day    Packs/day: 0.50    Types: Cigarettes   Smokeless tobacco: Never  Vaping Use   Vaping Use: Never used  Substance Use Topics   Alcohol use: No   Drug use: Yes    Home Medications Prior to Admission medications   Medication Sig Start Date End Date Taking? Authorizing Provider  dibucaine (NUPERCAINAL) 1 % OINT Place 1 application rectally 2 (two) times daily as needed for pain or hemorrhoids.    [provider]  etonogestrel (NEXPLANON) 68 MG IMPL implant Inject 68 mg into the skin once. 05/04/14    [provider]  fluticasone (FLONASE) 50 MCG/ACT nasal spray Place 2 sprays into the nose daily. 12/09/15 12/08/16  [provider]  ibuprofen (ADVIL,MOTRIN) 200 MG tablet Take 600 mg by mouth every 6 (six) hours as needed for moderate pain.  [provider]  metroNIDAZOLE (FLAGYL) 500 MG tablet Take 1 tablet (500 mg total) by mouth 2 (two) times daily. 01/31/16   Barrett Henle, PA-C  ondansetron (ZOFRAN ODT) 4 MG disintegrating tablet Take 1 tablet (4 mg total) by mouth every 8 (eight) hours as needed for nausea or vomiting. 01/31/16   Barrett Henle, PA-C  sertraline (ZOLOFT) 50 MG tablet Take 50 mg by mouth daily.    [provider]    Allergies    Patient has no known allergies.  Review of Systems   Review of Systems  Constitutional:  Negative for chills, fatigue and fever.  Gastrointestinal:  Negative for abdominal distention, abdominal pain, constipation, diarrhea, nausea and vomiting.  Genitourinary:  Positive for pelvic pain and vaginal bleeding.  Skin:  Negative for pallor.  Neurological:   Negative for dizziness, tremors, seizures, syncope, facial asymmetry, speech difficulty, weakness, light-headedness, numbness and headaches.  Psychiatric/Behavioral:  Negative for confusion and decreased concentration.   All other systems reviewed and are negative.  Physical Exam Updated Vital Signs BP 129/87   Pulse 83   Temp 98.1 F (36.7 C) (Oral)   Resp 18   Ht 5\' 3"  (1.6 m)   Wt 74.8 kg   LMP 09/30/2021   SpO2 98%   BMI 29.23 kg/m   Physical Exam Vitals and nursing note reviewed. Exam conducted with a chaperone present (Destiny, RN present for pelvic exam).  Constitutional:      General: She is not in acute distress.    Appearance: Normal appearance. She is normal weight. She is not ill-appearing, toxic-appearing or diaphoretic.  HENT:     Head: Normocephalic and atraumatic.  Cardiovascular:     Rate and Rhythm: Normal rate.  Pulmonary:     Effort: Pulmonary effort is normal. No respiratory distress.  Abdominal:     General: Abdomen is flat.     Palpations: Abdomen is soft.     Tenderness: There is abdominal tenderness in the right lower quadrant, suprapubic area and left lower quadrant. There is no right CVA tenderness, left CVA tenderness, guarding or rebound. Negative signs include Murphy's sign, Rovsing's sign, McBurney's sign, psoas sign and obturator sign.     Comments: Mild tenderness noted to bilateral lower quadrants and suprapubic area without guarding, rebound, or other abnormality  Genitourinary:    General: Normal vulva.     Exam position: Lithotomy position.     Comments: Dark red blood present in the vagina with some clots. Cervical os is closed  Some left sided adnexal tenderness noted on bimanual exam, no CMT Musculoskeletal:        General: Normal range of motion.     Cervical back: Normal range of motion.  Skin:    General: Skin is warm and dry.  Neurological:     General: No focal deficit present.     Mental Status: She is alert.  Psychiatric:         Mood and Affect: Mood normal.        Behavior: Behavior normal.    ED Results / Procedures / Treatments   Labs (all labs ordered are listed, but only abnormal results are displayed) Labs Reviewed  WET PREP, GENITAL - Abnormal; Notable for the following components:      Result Value   WBC, Wet Prep HPF POC FEW (*)    All other components within normal limits  CBC WITH DIFFERENTIAL/PLATELET - Abnormal; Notable for the following components:   WBC 13.9 (*)  Neutro Abs 9.3 (*)    All other components within normal limits  COMPREHENSIVE METABOLIC PANEL - Abnormal; Notable for the following components:   Potassium 3.1 (*)    Glucose, Bld 122 (*)    All other components within normal limits  URINALYSIS, ROUTINE W REFLEX MICROSCOPIC - Abnormal; Notable for the following components:   Color, Urine STRAW (*)    Specific Gravity, Urine 1.002 (*)    Hgb urine dipstick SMALL (*)    All other components within normal limits  I-STAT BETA HCG BLOOD, ED ([facility], [facility], [facility] ONLY) - Abnormal; Notable for the following components:   I-stat hCG, quantitative 5.0 (*)    All other components within normal limits  GC/CHLAMYDIA PROBE AMP ([place]) NOT AT [facility]    EKG None  Radiology No results found.  Procedures Procedures   Medications Ordered in ED Medications - No data to display  ED Course  I have reviewed the triage vital signs and the nursing notes.  Pertinent labs & imaging results that were available during my care of the patient were reviewed by me and considered in my medical decision making (see chart for details).    MDM Rules/Calculators/A&P                         Patient presents today with complaint of vaginal bleeding for 10 days. Recently began new birth control, this is her first menstrual cycle after starting this new medication. Spoke with her OB who recommended that she do a taper of her birth control to stop bleeding, however she got nervous and came to ER for  further evaluation. Has not began birth control taper at this time. Patient endorses lower abdominal pain, however states that it feels like normal menstrual cramping pain to her.  Patient also endorses some pelvic pain consistent with her menstrual cramps. She is nontoxic, nonseptic appearing, in no apparent distress. Labs, imaging and vitals reviewed.  Patient does not meet the SIRS or Sepsis criteria.  Patient does not have a surgical abdomin and there are no peritoneal signs.  No indication of appendicitis, bowel obstruction, bowel perforation, cholecystitis, diverticulitis.   Labs collected, moderate leukocytosis at 13.9, other labs unremarkable at this time. No anemia. Vitals unremarkable, patient is hemodynamically stable. Some left sided adnexal tenderness noted on bimanual exam, will order transvaginal US for further evaluation of this. [place] pending at shift change, if normal feel patient will be cleared for discharge to continue with birth control taper recommended by her OB/GYN and follow-up with them for further evaluation and management.  Findings and plan of care discussed with supervising physician Dr. Adela Lank who is in agreement.    Care handoff to Haze Rushing, PA-C at shift change   Final Clinical Impression(s) / ED Diagnoses Final diagnoses:  Left adnexal tenderness    Rx / DC Orders ED Discharge Orders     None        Vear Clock 10/10/21 2100    Melene Plan, DO 10/11/21 [phone]

## 2021-10-10 NOTE — ED Triage Notes (Signed)
Patient c/o lower abdominal pain and heavy vaginal bleeding with clots x 10 days.

## 2021-10-10 NOTE — ED Provider Notes (Signed)
  Physical Exam  BP (!) 148/90 (BP Location: Left Arm)   Pulse 78   Temp 98.1 F (36.7 C) (Oral)   Resp 18   Ht 5\' 3"  (1.6 m)   Wt 74.8 kg   LMP 09/30/2021   SpO2 99%   BMI 29.23 kg/m   Physical Exam  ED Course/Procedures     Procedures  MDM    Care of this patient assumed from preceding ED provider 11/30/2021, PA-C at time of shift change.  Please see her associated note for further insight of patient's ED course.  In brief this is a 35 year old female who presents with 10 days of constant vaginal bleeding.  She has already discussed her case with her OB/GYN whom she follows with for endometriosis; plan is for OCP taper and continued outpatient follow-up.  Patient became nervous today and presented to the ED for evaluation.  Per preceding ED provider, had some right adnexal tenderness on exam.  CBC with mild leukocytosis of 13.9, otherwise unremarkable without anemia.  CMP with mild hypokalemia of 3.1.  UA with hemoglobin otherwise not concerning for infection.  Wet mount unremarkable.  GC/chlamydia pending. At time of shift change she was pending transvaginal ultrasound.    Transvaginal ultrasound revealed normal pelvis without evidence of torsion or adnexal mass bilaterally.  No further work-up warranted in ER at this time.  She may follow-up with her OB/GYN outpatient as scheduled and continue with her prescribed OCP taper.  32 voiced understanding for medical evaluation and treatment plan.  Each of her questions was answered to her expressed satisfaction.  Return precautions were given.  Patient is well-appearing, stable, and appropriate for discharge at this time.  This chart was dictated using voice recognition software, Dragon. Despite the best efforts of this provider to proofread and correct errors, errors may still occur which can change documentation meaning.    Ephriam Knuckles, PA-C 10/10/21 1622    10/12/21, MD 10/17/21 1054

## 2021-10-11 LAB — GC/CHLAMYDIA PROBE AMP (~~LOC~~) NOT AT ARMC
Chlamydia: NEGATIVE
Comment: NEGATIVE
Comment: NORMAL
Neisseria Gonorrhea: NEGATIVE

## 2021-12-29 ENCOUNTER — Other Ambulatory Visit: Payer: Self-pay

## 2021-12-29 ENCOUNTER — Emergency Department (HOSPITAL_BASED_OUTPATIENT_CLINIC_OR_DEPARTMENT_OTHER)
Admission: EM | Admit: 2021-12-29 | Discharge: 2021-12-29 | Disposition: A | Discharge status: Home / Self Care | Payer: 59 | Attending: Emergency Medicine | Admitting: Emergency Medicine

## 2021-12-29 DIAGNOSIS — R000 Tachycardia, unspecified: Secondary | ICD-10-CM | POA: Diagnosis not present

## 2021-12-29 DIAGNOSIS — F17200 Nicotine dependence, unspecified, uncomplicated: Secondary | ICD-10-CM | POA: Diagnosis not present

## 2021-12-29 DIAGNOSIS — K1230 Oral mucositis (ulcerative), unspecified: Secondary | ICD-10-CM | POA: Diagnosis not present

## 2021-12-29 DIAGNOSIS — R6884 Jaw pain: Secondary | ICD-10-CM | POA: Diagnosis not present

## 2021-12-29 DIAGNOSIS — K121 Other forms of stomatitis: Secondary | ICD-10-CM

## 2021-12-29 MED ORDER — LIDOCAINE VISCOUS HCL 2 % MT SOLN: 15.0000 mL | OROMUCOSAL | 1 refills | Status: DC | PRN

## 2021-12-29 MED ORDER — LIDOCAINE VISCOUS HCL 2 % MT SOLN
15.0000 mL | Freq: Once | OROMUCOSAL | Status: AC
  Administered 2021-12-29: 15 mL via OROMUCOSAL
  Filled 2021-12-29: qty 15

## 2021-12-29 NOTE — ED Provider Notes (Signed)
[facility] EMERGENCY DEPT Provider Note   CSN: [id number] Arrival date & time: 12/29/21  1322     History Chief Complaint  Patient presents with   Jaw Pain    Veronica [name] is a 36 y.o. female presents emergency department for evaluation of right jaw pain for the past few days.  She reports she noticed some tongue and mouth sores yesterday that is made it hard to eat due to the pain/burning.  Patient ports she recently started Seroquel a little over a week ago.  Additionally, she reports she has a history of TMJ and was seen in urgent care a few days ago and given Flexeril and a shot of Toradol.  She reports that the Flexeril is mildly helping with her pain and she is feeling better, though she was concerned about her mouth ulcers.  She denies any dental pain, nasal congestion, rhinorrhea, sore throat.  She denies any fevers or chills.  Medical history includes bipolar type II and TMJ.  She denies any surgical history.  Daily medication includes Seroquel.  No known drug allergies.  Daily tobacco user.  HPI     Home Medications Prior to Admission medications   Medication Sig Start Date End Date Taking? Authorizing Provider  dibucaine (NUPERCAINAL) 1 % OINT Place 1 application rectally 2 (two) times daily as needed for pain or hemorrhoids.    [provider]  etonogestrel (NEXPLANON) 68 MG IMPL implant Inject 68 mg into the skin once. 05/04/14    [provider]  fluticasone (FLONASE) 50 MCG/ACT nasal spray Place 2 sprays into the nose daily. 12/09/15 12/08/16  [provider]  ibuprofen (ADVIL,MOTRIN) 200 MG tablet Take 600 mg by mouth every 6 (six) hours as needed for moderate pain.    [provider]  metroNIDAZOLE (FLAGYL) 500 MG tablet Take 1 tablet (500 mg total) by mouth 2 (two) times daily. 01/31/16   Barrett Henle, PA-C  ondansetron (ZOFRAN ODT) 4 MG disintegrating tablet Take 1 tablet (4 mg total) by mouth every 8  (eight) hours as needed for nausea or vomiting. 01/31/16   Barrett Henle, PA-C  Oxcarbazepine (TRILEPTAL) 300 MG tablet Take 300 mg by mouth 2 (two) times daily. 09/21/21   [provider]  sertraline (ZOLOFT) 50 MG tablet Take 50 mg by mouth daily.    [provider]  VRAYLAR 6 MG CAPS Take 6 mg by mouth daily. 09/25/21   [provider]      Allergies    Patient has no known allergies.    Review of Systems   Review of Systems  Constitutional:  Negative for chills and fever.  HENT:  Positive for mouth sores. Negative for congestion, dental problem, ear pain, rhinorrhea and sore throat.        Reports jaw pain  Eyes:  Negative for pain and visual disturbance.  Respiratory:  Negative for cough and shortness of breath.   Cardiovascular:  Negative for chest pain and palpitations.  Gastrointestinal:  Negative for abdominal pain and vomiting.  Genitourinary:  Negative for dysuria and hematuria.  Musculoskeletal:  Negative for arthralgias and back pain.  Skin:  Negative for color change and rash.  Neurological:  Negative for seizures, syncope, weakness and headaches.  All other systems reviewed and are negative.  Physical Exam Updated Vital Signs BP (!) 157/95 (BP Location: Right Arm)    Pulse (!) 130    Temp 99 F (37.2 C)    Resp 16  Ht 5\' 3"  (1.6 m)    Wt 68 kg    LMP 10/21/2021    SpO2 100%    BMI 26.57 kg/m  Physical Exam Vitals and nursing note reviewed.  Constitutional:      General: She is not in acute distress.    Appearance: Normal appearance. She is not toxic-appearing.  HENT:     Head: Normocephalic and atraumatic.     Right Ear: Tympanic membrane, ear canal and external ear normal.     Left Ear: Tympanic membrane, ear canal and external ear normal.     Ears:     Comments: No mastoid tenderness bilaterally    Nose: Nose normal.     Mouth/Throat:     Mouth: Mucous membranes are moist.     Pharynx: No posterior oropharyngeal erythema.      Comments: Since missing multiple teeth and has poor dentition.  No palpable abscess, induration, or fluctuance noted over palpated by me oral cavity.  The patient has multiple ulcerative lesions on the lateral sides of her tongue, tip of her tongue, and on the right upper back gum area.  No thrush seen.  Patient is tender to touch anteriorly to her right ear going down into the angle of her mandible.  No overlying skin changes, rash, erythema, edema, or abrasion noted. Eyes:     General: No scleral icterus. Cardiovascular:     Rate and Rhythm: Regular rhythm. Tachycardia present.  Pulmonary:     Effort: Pulmonary effort is normal. No respiratory distress.  Skin:    General: Skin is dry.     Findings: No rash.  Neurological:     General: No focal deficit present.     Mental Status: She is alert. Mental status is at baseline.  Psychiatric:        Mood and Affect: Mood normal.    ED Results / Procedures / Treatments   Labs (all labs ordered are listed, but only abnormal results are displayed) Labs Reviewed - No data to display  EKG None  Radiology No results found.  Procedures Procedures   Medications Ordered in ED Medications - No data to display  ED Course/ Medical Decision Making/ A&P                           Medical Decision Making  36 year old female presents emergency department for evaluation of right-sided jaw pain as well as mouth ulcers.  Differential diagnosis includes but is not limited to herpes stomatitis, leukoplakia, thrush, hand-foot-and-mouth, dental disease, TMJ, dislocation.  Vital signs show hypertension with mild tachycardia.  Patient reports she gets very nervous when she is at hospitals or doctors offices.  Physical exam shows multiple ulcerations lesions to the tongue and gums.  She was tender anteriorly to her right ear going down into her right angle of her mandible.  No overlying skin changes noted.  No palpable dental abscess or infection  visualized.  Her ulcerations are likely due to her Seroquel as she recently started it within a week.  I offered her a benzodiazepine while she was here, but the patient reported her pain is not that bad to not be was to drive for the next few hours.  She reported she is feeling better with the Flexeril which is like to stick with that.  I gave her viscous lidocaine while she was here which she reports only gave her mild relief.  I discussed with our pharmacist for  additional recommendations.  Wrote prescription for viscous lidocaine for the patient to trial while at home before meals.  Advised her to follow-up with a dentist for evaluation of her TMJ and reevaluation of her ulcers.  Discharge instructions and paperwork printed.  Went back to discussed return precautions with patient it is over the patient had eloped.  Viscous lidocaine sent into pharmacy on file.  Final Clinical Impression(s) / ED Diagnoses Final diagnoses:  Jaw pain  Mouth ulcers    Rx / DC Orders ED Discharge Orders          Ordered    lidocaine (XYLOCAINE) 2 % solution  As needed        12/29/21 1715              Sherrell Puller, PA-C 12/29/21 2259    Davonna Belling, MD 12/30/21 [phone]

## 2021-12-29 NOTE — ED Notes (Signed)
Patient left before D/c paperwork was provided.

## 2021-12-29 NOTE — ED Triage Notes (Addendum)
Patinet reports to the ER for jaw pain that she was put on flexeril for at an UC. Patient reports she is also on Seroquel and feels manic today. Patient reports she has sores in her mouth and is having trouble sleeping.

## 2021-12-29 NOTE — Discharge Instructions (Addendum)
You were seen here today for evaluation of your jaw pain as well as mouth ulcers.  Please continue taking your already prescribed Flexeril for your jaw pain as needed.  Please not take if there is chance of pregnancy or while operating heavy machinery/driving.  You can rotate between Tylenol and ibuprofen as needed for pain.  Ultimately, you will need to follow-up with a dentist for your mouth ulcers and jaw pain.  Soft foods such as mashed potatoes, broth, applesauce for the next few days should help ease a flareup of your TMJ.  Your mouth ulcers are likely due to your Seroquel.  I prescribed you topical lidocaine to swish in your mouth before eating to help assist with pain.  Please consult your mental health provider with this reaction as they can put you on a more appropriate medication.  If you have any concern, new or worsening symptoms, please return the nearest emergency department for reevaluation.

## 2021-12-31 ENCOUNTER — Emergency Department (HOSPITAL_COMMUNITY)
Admission: EM | Admit: 2021-12-31 | Discharge: 2021-12-31 | Disposition: A | Discharge status: Home / Self Care | Payer: 59 | Attending: Emergency Medicine | Admitting: Emergency Medicine

## 2021-12-31 ENCOUNTER — Ambulatory Visit (HOSPITAL_COMMUNITY)
Admission: EM | Admit: 2021-12-31 | Discharge: 2021-12-31 | Disposition: A | Discharge status: Home / Self Care | Payer: 59

## 2021-12-31 ENCOUNTER — Encounter (HOSPITAL_COMMUNITY): Payer: Self-pay | Admitting: Emergency Medicine

## 2021-12-31 DIAGNOSIS — F15959 Other stimulant use, unspecified with stimulant-induced psychotic disorder, unspecified: Secondary | ICD-10-CM | POA: Diagnosis not present

## 2021-12-31 DIAGNOSIS — R443 Hallucinations, unspecified: Secondary | ICD-10-CM | POA: Insufficient documentation

## 2021-12-31 LAB — RAPID URINE DRUG SCREEN, HOSP PERFORMED
Amphetamines: POSITIVE — AB
Barbiturates: NOT DETECTED
Benzodiazepines: NOT DETECTED
Cocaine: NOT DETECTED
Opiates: NOT DETECTED
Tetrahydrocannabinol: NOT DETECTED

## 2021-12-31 LAB — COMPREHENSIVE METABOLIC PANEL
ALT: 15 U/L (ref 0–44)
AST: 21 U/L (ref 15–41)
Albumin: 3.8 g/dL (ref 3.5–5.0)
Alkaline Phosphatase: 61 U/L (ref 38–126)
Anion gap: 9 (ref 5–15)
BUN: <5 mg/dL — ABNORMAL LOW (ref 6–20)
CO2: 25 mmol/L (ref 22–32)
Calcium: 9.2 mg/dL (ref 8.9–10.3)
Chloride: 104 mmol/L (ref 98–111)
Creatinine, Ser: 0.73 mg/dL (ref 0.44–1.00)
GFR, Estimated: >60 mL/min (ref >60)
Glucose, Bld: 85 mg/dL (ref 70–99)
Potassium: 4.1 mmol/L (ref 3.5–5.1)
Sodium: 138 mmol/L (ref 135–145)
Total Bilirubin: 0.3 mg/dL (ref 0.3–1.2)
Total Protein: 6.6 g/dL (ref 6.5–8.1)

## 2021-12-31 LAB — ETHANOL: Alcohol, Ethyl (B): <10 mg/dL (ref <10)

## 2021-12-31 LAB — CBC
HCT: 43.1 % (ref 36.0–46.0)
Hemoglobin: 14.2 g/dL (ref 12.0–15.0)
MCH: 30.3 pg (ref 26.0–34.0)
MCHC: 32.9 g/dL (ref 30.0–36.0)
MCV: 91.9 fL (ref 80.0–100.0)
Platelets: 316 10*3/uL (ref 150–400)
RBC: 4.69 MIL/uL (ref 3.87–5.11)
RDW: 13.4 % (ref 11.5–15.5)
WBC: 11.6 10*3/uL — ABNORMAL HIGH (ref 4.0–10.5)
nRBC: 0 % (ref 0.0–0.2)

## 2021-12-31 LAB — ACETAMINOPHEN LEVEL: Acetaminophen (Tylenol), Serum: <10 ug/mL — ABNORMAL LOW (ref 10–30)

## 2021-12-31 LAB — I-STAT BETA HCG BLOOD, ED (MC, WL, AP ONLY): I-stat hCG, quantitative: <5 m[IU]/mL (ref <5)

## 2021-12-31 LAB — SALICYLATE LEVEL: Salicylate Lvl: <7 mg/dL — ABNORMAL LOW (ref 7.0–30.0)

## 2021-12-31 MED ORDER — LORAZEPAM 1 MG PO TABS: 1.0000 mg | ORAL_TABLET | Freq: Three times a day (TID) | ORAL | 0 refills | Status: DC | PRN

## 2021-12-31 MED ORDER — LIDOCAINE VISCOUS HCL 2 % MT SOLN
15.0000 mL | Freq: Once | OROMUCOSAL | Status: AC
  Administered 2021-12-31: 15 mL via OROMUCOSAL
  Filled 2021-12-31: qty 15

## 2021-12-31 MED ORDER — LORAZEPAM 1 MG PO TABS
1.0000 mg | ORAL_TABLET | Freq: Once | ORAL | Status: AC
  Administered 2021-12-31: 1 mg via ORAL
  Filled 2021-12-31: qty 1

## 2021-12-31 NOTE — Discharge Instructions (Signed)
As discussed, it is important that you follow-up at our affiliated behavioral health urgent care center.  They can continue the conversation about appropriate medication for your bipolar disorder.  Return here for concerning changes in your condition.

## 2021-12-31 NOTE — BH Assessment (Addendum)
Veronica [name], Routine, MR #[id number]; 36 years old presents this date with her boyfriend, Shirlean Kelly, [phone]. Pt denies SI and HI.  Pt reports that she is hearing voices.  Pt reports that she needs her medication change.  Pt admits to prior MH diagnosis; also, currently taking prescribed medication for symptom management.,  MSE signed by patient

## 2021-12-31 NOTE — ED Provider Notes (Signed)
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 female patient who presents to the [facility] accompanied by her boyfriend with a chief complaint of "experiencing hallucinations after stopping Seroquel."  On evaluation, patient is alert and oriented x4. Her thought process is logical and relevant. Her speech is clear and coherent at a slow rate. Her mood is dysphoric and affect is congruent. She is noted to be ambulating slowly and holding onto her boyfriend as she proceeds to the assessment room. Patient was seen this afternoon in [facility] and medically cleared. At that time, she was prescribed Ativan 1 mg 3 times daily daily as needed for anxiety and recommended to follow up with psychiatry.  Patient reports a history of bipolar and states that she has not had any assessment in years. She reports that she is currently following up with [facility] for psychiatric treatment. She reports that Dr. Ky Barban at [facility] recently took her off for Vraylar about a month ago and started her on Seroquel. She states that she stopped taking the Seroquel about a week ago due to side effects of sleep walking, agitation, and anger. She reports that she is currently taking Trileptal 300 mg twice daily for bipolar. She reports that she has an upcoming appointment with Dr. Donnelly Angelica later this month for medication management.  She denies having thoughts of wanting to kill herself or others. She endorses auditory hallucinations this morning when she thought she heard someone calling her name. She endorses visual hallucinations this morning while at the hospital and states that she thought she saw ants crawling up the wall. She denies active hallucinations at this time. There is no  clinical evidence that she is responding to internal or external stimuli at this time.  When asked if she uses any illicit drugs as a result of a positive urine drug screen for amphetamines at the ED today, she states that she has been using meth for the past month. She reports  using meth 1 or 2 times per week.  She reports that she last used meth yesterday. She is unable to quantify how much meth she uses on average.  I discussed with the patient that the hallucinations are most likely associated with her meth use as it increases the dopamine in the brain and has been shown to be associated with psychosis. I discussed with the patient refraining from using meth. I discussed with the patient substance abuse resources in the community. Patient appears hesitant to substance abuse treatment. Patient encouraged to follow up with [facility] for medication management and was provided with additional outpatient resources for psychiatry and substance abuse treatment.  Psychiatric Specialty Exam  Presentation  General Appearance:Casual  Eye Contact:Minimal  Speech:Clear and Coherent  Speech Volume:Normal  Handedness:No data recorded  Mood and Affect  Mood:Dysphoric  Affect:Congruent   Thought Process  Thought Processes:Coherent  Descriptions of Associations:Intact  Orientation:Full (Time, Place and Person)  Thought Content:Logical    Hallucinations:Auditory; Visual  Ideas of Reference:No data recorded Suicidal Thoughts:No  Homicidal Thoughts:No   Sensorium  Memory:Immediate Fair; Recent Fair; Remote Fair  Judgment:Intact  Insight:Present   Executive Functions  Concentration:Fair  Attention Span:Fair  [place]   Psychomotor Activity  Psychomotor Activity:Normal   Assets  Assets:Desire for Improvement; Intimacy; Leisure Time;  Physical Health; Social Support   Sleep  Sleep:Fair  Number of hours: No data recorded  No  data recorded  Physical Exam: Physical Exam Constitutional:      Appearance: Normal appearance.  Cardiovascular:     Rate and Rhythm: Tachycardia present.     Comments: Hypertensive. Medically cleared at noontime at [facility] Pulmonary:     Effort: Pulmonary effort is normal.  Musculoskeletal:     Cervical back: Normal range of motion.  Neurological:     Mental Status: She is alert and oriented to person, place, and time.   Review of Systems  Constitutional: Negative.   HENT: Negative.    Eyes: Negative.   Respiratory: Negative.    Cardiovascular: Negative.   Gastrointestinal: Negative.   Genitourinary: Negative.   Musculoskeletal: Negative.   Skin: Negative.   Neurological: Negative.   Endo/Heme/Allergies: Negative.   Psychiatric/Behavioral:  Positive for hallucinations and substance abuse.   Blood pressure (!) 156/103, pulse (!) 117, temperature 98 F (36.7 C), resp. rate 18, SpO2 100 %, unknown if currently breastfeeding. There is no height or weight on file to calculate BMI.  Musculoskeletal: Strength & Muscle Tone: within normal limits Gait & Station: normal Patient leans: N/A   [facility] MSE Discharge Disposition for Follow up and Recommendations: Based on my evaluation the patient does not appear to have an emergency medical condition and can be discharged with resources and follow up care in outpatient services for Medication Management, Substance Abuse Intensive Outpatient Program, Individual Therapy, and Group Therapy   Marissa Calamity, NP 12/31/2021, 2:46 PM

## 2021-12-31 NOTE — ED Triage Notes (Signed)
Pt reports sores on her tongue that is not getting better. Recently started on Seroquel a few weeks ago. Pt reports hallucinations starting 2 days ago. Hx of bipolar disorder. Denies SI or HI.

## 2021-12-31 NOTE — ED Notes (Signed)
Patient discharged by NP

## 2021-12-31 NOTE — Discharge Instructions (Signed)
Discharge recommendations:  Patient is to take medications as prescribed. Please see information for follow-up appointment with psychiatry and therapy. Please follow up with your primary care provider for all medical related needs.   Therapy: We recommend that patient participate in individual therapy to address mental health concerns.  Medications: The parent/guardian is to contact a medical professional and/or outpatient provider to address any new side effects that develop. Parent/guardian should update outpatient providers of any new medications and/or medication changes.   Atypical antipsychotics: If you are prescribed an atypical antipsychotic, it is recommended that your height, weight, BMI, blood pressure, fasting lipid panel, and fasting blood sugar be monitored by your outpatient providers.  Safety:  The patient should abstain from use of illicit substances/drugs and abuse of any medications. If symptoms worsen or do not continue to improve or if the patient becomes actively suicidal or homicidal then it is recommended that the patient return to the closest hospital emergency department, the [facility], or call 911 for further evaluation and treatment. National Suicide Prevention Lifeline 1-800-SUICIDE or 914-307-4193.  About 988 988 offers 24/7 access to trained crisis counselors who can help people experiencing mental health-related distress. People can call or text 988 or chat 988lifeline.org for themselves or if they are worried about a loved one who may need crisis support.   Please contact one of the following facilities to start medication management and therapy services:   [facility] [address] [place] [address]  [place], [place] [address] [phone]   [facility]  [address] [place], [place] [address] [phone]  [facility] [place]  [address]  [place], [place] [address] [phone]  [facility]  [address]  [place], [place] [address] [phone]  [facility]  [address] [place], [place] [address] [phone]  [facility]  [address]enee Rival  [place], [place] [address] [phone]  [facility] - Admissions are currently completed Monday through Friday at 8am; both appointments and walk-ins are accepted.  Any individual that is a [facility] resident may present for a substance abuse screening and assessment for admission.  A person may be referred by numerous sources or self-refer. Potential clients will be screened for medical necessity and appropriateness for the program. Clients must meet criteria for high-intensity residential treatment services.  If clinically appropriate, a client will continue with the comprehensive clinical assessment and intake process, as well as enrollment in the [facility].  Address: [address] [place], [place] [address] Admin Hours: Mon-Fri 8AM to [facility] Center Hours: 24/7 Phone: [phone] Fax: [phone]  [facility]) Facility Based Crisis:  These are 3 locations for services: Please call before arrival   Address: [address] [place], [place] [address] Phone: [phone]  Address: [address] [place], [place] [address] Phone#: [phone]  Address: [address] [place], [place] [address] Phone#: [phone]   [facility]): (offers outpatient therapy and intensive outpatient substance abuse therapy).  [address], [place], [place] [address] Phone: [phone]  [facility]: Offers FREE recovery skills classes, support groups, 1:1 Peer Support, and Compeer Classes. [address] [place], [place] [address] Phone: [phone] (Call to complete intake).  [facility] [address] [place],  [place] [address] Phone: [phone] ext: [phone] [facility] provides food, shelter and other programs and services to the homeless men of [place] through our [employer].  By offering safe shelter, three meals a day, clean clothing, Biblical counseling, financial planning, vocational training, GED/education and employment assistance, we've helped mend the shattered lives of many homeless men since opening in 1974.  We have approximately 267 beds available, with a max of 312 beds including mats for emergency situations and currently house an average of 270 men a night.  Prospective Client Check-In Information Photo ID Required (State/ Out of State/ [facility]) - if photo ID is not available, clients are required to have a printout of a police/sheriff's criminal history report. Help out with chores around the [place]. No sex offender of any type (pending, charged, registered and/or any other sex related offenses) will be permitted to check in. Must be willing to abide by all rules, regulations, and policies established by the [employer]. The following will be provided - shelter, food, clothing, and biblical counseling. If you or someone you know is in need of assistance at our [facility] shelter in [place], [place], please call [phone] ext. [phone].  [facility]-will provide timely access to mental health services for children and adolescents (4-17) and adults presenting in a mental health crisis. The program is designed for those who need urgent Behavioral Health or Substance Use treatment and are not experiencing a medical crisis that would typically require an emergency room visit.    [address] [place], [place] [address] Phone: [phone] [email or web address]  [facility]: Phone#: [phone]  The [facility] SA Intensive Outpatient Program  (SAIOP) means structured individual and group addiction activities and services that are provided at an outpatient program designed to assist adult and adolescent consumers to begin recovery and learn skills for recovery maintenance. The [facility] SAIOP program is offered at least 3 hours a day, 3 days a week.SAIOP services shall include a structured program consisting of, but not limited to, the following services: Individual counseling and support; Group counseling and support; Family counseling, training or support; Biochemical assays to identify recent drug use (e.g., urine drug screens); Strategies for relapse prevention to include community and social support systems in treatment; Life skills; Crisis contingency planning; Disease Management; and Treatment support activities that have been adapted or specifically designed for persons with physical disabilities, or persons with co-occurring disorders of mental illness and substance abuse/dependence or mental retardation/developmental disability and substance abuse/dependence. Phone: [phone]  Address:   The [facility] will also offer the following outpatient services: (Monday through Friday 8am-5pm)   Partial Hospitalization Program (PHP) Substance Abuse Intensive Outpatient Program (SA-IOP) Group Therapy Medication Management Peer Living Room We also provide (24/7):  Assessments: Our mental health clinician and providers will conduct a focused mental health evaluation, assessing for immediate safety concerns and further mental health needs. Referral: Our team will provide resources and help connect to community based mental health treatment, when indicated, including psychotherapy, psychiatry, and other specialized behavioral health or substance use disorder services (for those not already in treatment). Transitional Care: Our team providers in person bridging and/or telephonic follow-up during the patient's transition to outpatient  services.  [facility] 24-Hour Call Center: [phone] Behavioral Health Crisis Line:

## 2021-12-31 NOTE — ED Provider Notes (Signed)
[facility] EMERGENCY DEPARTMENT Provider Note   CSN: [id number] Arrival date & time: 12/31/21  0755     History  Chief Complaint  Patient presents with   Hallucinations    Veronica [name] is a 36 y.o. female.  HPI Patient with history of bipolar disorder presents with hyperactivity, hallucinations.  She specifically denies homicidal or suicidal ideation.  She notes that she has been working with a psychiatrist, had recent cessation of 1 antipsychotic and initiation of Seroquel.  She last took this medication few days ago, after she began feeling anxious, hyperactive, without focal pain, without suicidal or homicidal thoughts.  She notes occasional sensation of seeing things that are not there as well.    Home Medications Prior to Admission medications   Medication Sig Start Date End Date Taking? Authorizing Provider  LORazepam (ATIVAN) 1 MG tablet Take 1 tablet (1 mg total) by mouth 3 (three) times daily as needed for anxiety. 12/31/21  Yes Gerhard MunchLockwood, Jessilyn Catino, MD  dibucaine (NUPERCAINAL) 1 % OINT Place 1 application rectally 2 (two) times daily as needed for pain or hemorrhoids.    [provider]  etonogestrel (NEXPLANON) 68 MG IMPL implant Inject 68 mg into the skin once. 05/04/14    [provider]  fluticasone (FLONASE) 50 MCG/ACT nasal spray Place 2 sprays into the nose daily. 12/09/15 12/08/16  [provider]  ibuprofen (ADVIL,MOTRIN) 200 MG tablet Take 600 mg by mouth every 6 (six) hours as needed for moderate pain.    [provider]  lidocaine (XYLOCAINE) 2 % solution Use as directed 15 mLs in the mouth or throat as needed for mouth pain. 12/29/21   Achille Richansom, Riley, PA-C  metroNIDAZOLE (FLAGYL) 500 MG tablet Take 1 tablet (500 mg total) by mouth 2 (two) times daily. 01/31/16   Barrett HenleNadeau, Nicole Elizabeth, PA-C  ondansetron (ZOFRAN ODT) 4 MG disintegrating tablet Take 1 tablet (4 mg total) by mouth every 8 (eight) hours as needed for  nausea or vomiting. 01/31/16   Barrett HenleNadeau, Nicole Elizabeth, PA-C  Oxcarbazepine (TRILEPTAL) 300 MG tablet Take 300 mg by mouth 2 (two) times daily. 09/21/21   [provider]  sertraline (ZOLOFT) 50 MG tablet Take 50 mg by mouth daily.    [provider]  VRAYLAR 6 MG CAPS Take 6 mg by mouth daily. 09/25/21   [provider]      Allergies    Patient has no known allergies.    Review of Systems   Review of Systems  Constitutional:        Per HPI, otherwise negative  HENT:         Per HPI, otherwise negative  Respiratory:         Per HPI, otherwise negative  Cardiovascular:        Per HPI, otherwise negative  Gastrointestinal:  Negative for vomiting.  Endocrine:       Negative aside from HPI  Genitourinary:        Neg aside from HPI   Musculoskeletal:        Per HPI, otherwise negative  Skin: Negative.   Neurological:  Negative for syncope.  Psychiatric/Behavioral:  Positive for decreased concentration, dysphoric mood and sleep disturbance. Negative for suicidal ideas. The patient is nervous/anxious and is hyperactive.    Physical Exam Updated Vital Signs BP (!) 142/98 (BP Location: Left Arm)    Pulse 100    Temp 99.1 F (37.3 C)    Resp 15    Ht  5\' 3"  (1.6 m)    Wt 70.3 kg    SpO2 100%    BMI 27.46 kg/m  Physical Exam Vitals and nursing note reviewed.  Constitutional:      General: She is not in acute distress.    Appearance: She is well-developed.  HENT:     Head: Normocephalic and atraumatic.     Mouth/Throat:   Eyes:     Conjunctiva/sclera: Conjunctivae normal.  Cardiovascular:     Rate and Rhythm: Normal rate and regular rhythm.  Pulmonary:     Effort: Pulmonary effort is normal. No respiratory distress.     Breath sounds: Normal breath sounds. No stridor.  Abdominal:     General: There is no distension.  Skin:    General: Skin is warm and dry.  Neurological:     Mental Status: She is alert and oriented to person, place, and time.      Cranial Nerves: No cranial nerve deficit.  Psychiatric:        Mood and Affect: Mood is anxious.        Thought Content: Thought content is not paranoid. Thought content does not include homicidal or suicidal ideation.        Cognition and Memory: Cognition is not impaired. Memory is not impaired.    ED Results / Procedures / Treatments   Labs (all labs ordered are listed, but only abnormal results are displayed) Labs Reviewed  COMPREHENSIVE METABOLIC PANEL - Abnormal; Notable for the following components:      Result Value   BUN <5 (*)    All other components within normal limits  SALICYLATE LEVEL - Abnormal; Notable for the following components:   Salicylate Lvl <7.0 (*)    All other components within normal limits  ACETAMINOPHEN LEVEL - Abnormal; Notable for the following components:   Acetaminophen (Tylenol), Serum <10 (*)    All other components within normal limits  CBC - Abnormal; Notable for the following components:   WBC 11.6 (*)    All other components within normal limits  RAPID URINE DRUG SCREEN, HOSP PERFORMED - Abnormal; Notable for the following components:   Amphetamines POSITIVE (*)    All other components within normal limits  RESP PANEL BY RT-PCR (FLU A&B, COVID) ARPGX2  ETHANOL  I-STAT BETA HCG BLOOD, ED ([facility], [facility], [facility] ONLY)    EKG EKG Interpretation  Date/Time:  Sunday December 31 2021 08:26:56 EST Ventricular Rate:  119 PR Interval:  142 QRS Duration: 76 QT Interval:  310 QTC Calculation: 436 R Axis:   103 Text Interpretation: Sinus tachycardia Right atrial enlargement Rightward axis Anterior injury pattern Abnormal ECG No previous ECGs available Confirmed by [birth year] [phone]) on 12/31/2021 11:26:22 AM  Radiology No results found.  Procedures Procedures    Medications Ordered in ED Medications  LORazepam (ATIVAN) tablet 1 mg (has no administration in time range)  lidocaine (XYLOCAINE) 2 % viscous mouth solution 15 mL (has no administration  in time range)    ED Course/ Medical Decision Making/ A&P  12:33 PM Patient awake and alert, ambulatory, no distress                         Medical Decision Making  Adult female with bipolar disorder presents with manic behavior, possible hallucination.  She specifically denies suicidal or homicidal ideation she has insight into her presentation.  We discussed her recent change in medication, there is consideration of this versus other phenomena such  as infection or serotonin syndrome contributing to her episode.  But she is awake, alert, afebrile, and with amphetamine positive screen there are some suspicion for this contributing to her presentation.  EKG reviewed, unremarkable, labs notable as above for amphetamines, mild leukocytosis otherwise reassuring.  Patient appropriate for outpatient follow-up tomorrow in our ambulatory behavioral health clinic with a short course of benzodiazepine for symptom management.        Final Clinical Impression(s) / ED Diagnoses Final diagnoses:  Hallucinations    Rx / DC Orders ED Discharge Orders          Ordered    LORazepam (ATIVAN) 1 MG tablet  3 times daily PRN        12/31/21 1219              Gerhard Munch, MD 12/31/21 1234

## 2021-12-31 NOTE — ED Provider Triage Note (Signed)
Emergency Medicine Provider Triage Evaluation Note  Carollyn Veronica [name] , a 36 y.o. female  was evaluated in triage.  Pt complains of audio and visual hallucinations after stopping Seroquel about 3 days ago.  Patient was recently placed on this medication by her psychiatric provider, but she did not like the side effects of it so she recently stopped it.  She states that she is seeing things that are not there, and is hearing voices attempting to have conversations with her.  Adamantly denies suicidal and homicidal ideation.  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  Review of Systems  Positive: Visual hallucinations, auditory hallucinations, jaw pain Negative: Suicidal ideation, homicidal ideation  Physical Exam  BP (!) 143/116 (BP Location: Right Arm)    Pulse (!) 121    Temp 98.5 F (36.9 C) (Oral)    Resp 19    Ht 5\' 3"  (1.6 m)    Wt 70.3 kg    SpO2 100%    BMI 27.46 kg/m  Gen:   Awake, no distress   Resp:  Normal effort  MSK:   Moves extremities without difficulty  Other:    Medical Decision Making  Medically screening exam initiated at 8:13 AM.  Appropriate orders placed.  Lamya MACKENSIE BILLEN was informed that the remainder of the evaluation will be completed by another provider, this initial triage assessment does not replace that evaluation, and the importance of remaining in the ED until their evaluation is complete.  Patient is here voluntarily and wanting psychiatric evaluation.  Will obtain medical screening labs, and consult TTS   Ayyan Sites T, PA-C 12/31/21 0815

## 2021-12-31 NOTE — ED Notes (Signed)
Pt did not want covid test

## 2022-01-15 ENCOUNTER — Telehealth (HOSPITAL_COMMUNITY): Payer: Self-pay | Admitting: Family Medicine

## 2022-01-15 NOTE — BH Assessment (Signed)
Care Management - [facility] Follow Up Discharges   Writer attempted to make contact with patient today and was unsuccessful.  Writer left a HIPPA compliant voice message.   Per chart review, patient has a follow up meeting with with Dr. Wilson Singer at [facility].

## 2022-09-03 ENCOUNTER — Telehealth (INDEPENDENT_AMBULATORY_CARE_PROVIDER_SITE_OTHER): Payer: Medicaid Other | Admitting: Psychiatry

## 2022-09-03 ENCOUNTER — Encounter (HOSPITAL_COMMUNITY): Payer: Self-pay | Admitting: Psychiatry

## 2022-09-03 ENCOUNTER — Ambulatory Visit (INDEPENDENT_AMBULATORY_CARE_PROVIDER_SITE_OTHER): Payer: Medicaid Other | Admitting: Mental Health

## 2022-09-03 ENCOUNTER — Encounter (HOSPITAL_COMMUNITY): Payer: Self-pay

## 2022-09-03 ENCOUNTER — Encounter (HOSPITAL_COMMUNITY): Payer: Self-pay | Admitting: Mental Health

## 2022-09-03 DIAGNOSIS — F39 Unspecified mood [affective] disorder: Secondary | ICD-10-CM | POA: Insufficient documentation

## 2022-09-03 DIAGNOSIS — F4310 Post-traumatic stress disorder, unspecified: Secondary | ICD-10-CM | POA: Insufficient documentation

## 2022-09-03 DIAGNOSIS — F419 Anxiety disorder, unspecified: Secondary | ICD-10-CM

## 2022-09-03 DIAGNOSIS — N809 Endometriosis, unspecified: Secondary | ICD-10-CM

## 2022-09-03 DIAGNOSIS — Z79899 Other long term (current) drug therapy: Secondary | ICD-10-CM

## 2022-09-03 DIAGNOSIS — F1591 Other stimulant use, unspecified, in remission: Secondary | ICD-10-CM

## 2022-09-03 MED ORDER — GABAPENTIN 100 MG PO CAPS: ORAL_CAPSULE | ORAL | 0 refills | Status: DC

## 2022-09-03 MED ORDER — LITHIUM CARBONATE ER 300 MG PO TBCR: EXTENDED_RELEASE_TABLET | ORAL | 1 refills | Status: DC

## 2022-09-03 NOTE — Progress Notes (Signed)
Psychiatric Initial Adult Assessment  Patient Identification: Veronica [name] MRN:  [id number] Date of Evaluation:  09/03/2022 Referral Source: Tracey Harries, MD  Assessment:  Veronica [name] is a 36 y.o. y.o. female with a history of bipolar disorder, anxiety, substance use, and endometriosis who presents to [facility] via video conferencing for initial evaluation of mood instability and anxiety.  Patient reports receiving initial diagnosis of bipolar 2 as an adolescent however notes that this was during a period of concurrent substance use and endorses intermittent substance use up until January of this year (last use of methamphetamines).  Currently endorses small amount of cannabis use weekly.  Patient does endorse episodes of mood fluctuations independent of substance use that raise concern for hypomania and possible bipolar 2 diagnosis; further evaluation is needed for confirmation of past depressive episodes (although it does not appear that depressive symptoms have been significant component of mood episodes).  It does appear clear that patient experiences significant anxiety as well as trauma related symptoms consistent with PTSD.  Given her report of ongoing mood stability and interference with employment, patient is amenable to initiation of mood stabilizer and will start lithium given lack of past medication trial.  Explored with patient options for anxiety to minimize use of prn benzodiazepines (previously using Klonopin infrequently; grandmother's Xanax a few times weekly - counseled against using other's prescriptions).  She is amenable to retrial of gabapentin given some efficacy of this medication in the past.  Plan to return to care in 4 weeks.  Plan:  # Unspecified mood disorder, consider bipolar 2 disorder Past medication trials: Depakote (brain fogging); Trileptal 300 mg BID (ineffective); Seroquel (sleepwalking, disorientation); Vraylar 6 mg daily  (somewhat effective but not affordable); Latuda (jittery); lumateperone (oversedation); Abilify (worsening mood lability)  Status of problem: New problem to this provider Interventions: -- START lithium 300 mg nightly for 1 week with increase to 300 mg BID thereafter  -- Risks, benefits, and side effects including but not limited to drowsiness, GI effects, decreased kidney function, hypothyroidism were reviewed with informed consent provided -- Lithium level ordered with instructions to obtain after 5 full days of BID dosing -- Established with Stephan Minister, [facility] for psychotherapy  # Anxiety  PTSD Past medication trials: propranolol (dizziness, decreased BP); gabapentin 200 mg TID (effective for intensity of panic attacks but still persisted); prazosin (only slightly reduced nightmares); hydroxyzine Status of problem: New problem to this provider Interventions: -- START gabapentin 100 mg TID with instructions to increase to 200 mg TID (week 2) and 300 mg TID (week 3) as tolerated   -- Risks, benefits, and side effects including but not limited to drowsiness, dizziness were reviewed with informed consent provided -- Continue buspirone 10 mg TID; patient reports minimal efficacy currently and when at higher doses in the past. Will consider taper upon stabilization on alternative regimen.  # History of stimulant use (most recently Jan 2023) Status of problem: Improving Interventions: -- Continue to monitor and encourage sustained abstinence  # Medication monitoring Interventions: -- Lithium:  - Lithium level ordered  - Patient to obtain TSH through PCP this week  - Review of kidney function wnl (Jan 2023)  Patient was given contact information for behavioral health clinic and was instructed to call 911 for emergencies.   Subjective:  Chief Complaint:  Chief Complaint  Patient presents with   Anxiety   Mood lability    History of Present Illness:   Patient reports she was  diagnosed with  bipolar 2 disorder 15 years ago; has been on numerous medication trials.  Has also suffered from anxiety for quite a long time. Was a paramedic for 26 years and during that time anxiety worsened and she developed PTSD symptoms a/e/b night terrors, intrusive memories to past traumatic events hypervigilance to sirens, avoidance behaviors (if driving and sees emergency lights will turn the other way; avoids certain alarms on her phone).  Feels that PTSD symptoms are also compounded by childhood trauma.  Was forced to resign as a paramedic in 2019 due to mental health symptoms interfering with work.  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  Since then, has been doing professional development classes while looking for jobs.  States that when she was first diagnosed with bipolar disorder she did have concurrent substance use (stimulants, hallucinogens).  During episodes associated with substances, identifies elevated and irritable mood, grandiosity, moving and talking quickly, overspending, experienced decreased need for sleep (has gone up to 4 days without sleep), increased GDA, racing thoughts. No other risky or impulsive behaviors during that time.    However also endorses symptoms of hypomania outside of active substance use.  This last occurred in mid August during which she had a 2 to 3-day period characterized by increased anxiety, racing thoughts, increased spending, decreased need for sleep, and increased goal-directed activity.  Will experience depressed mood and feel sad but denies other depressive symptoms including low energy or motivation; oversleeping.  Feels she spends the most time in a mixed to elevated state. Endorses fleeting passive SI ("I can't do this anymore") but denies past suicide attempts.   Currently, denies feeling particularly elevated or irritable.   Identifies feeling very anxious, distractible with scattered thoughts.  Denies issues falling asleep but states she often wakes up in the middle of the night feeling panicky - may or may not be related to nightmares.  Appetite has been up and down with 7 pound weight loss over the last few months. Denies AVH outside of active substance use/withdrawal.  Current stressors include: Unemployment, living with grandpa who has dementia; other family stress  [facility] but relates difficult dynamics with provider there and wishes to transfer care.  Currently taking BuSpar which she feels is ineffective and Klonopin 0.5 mg few times a month for panic attacks.  Reviewed past medication trials (see above).  Discussed diagnostic conceptualization and concern for possible bipolar 2 although symptoms of anxiety can often overlap and past substance use confounds diagnosis.  Given episodes of mood instability since cessation of stimulant use in January, discussed it would be reasonable to trial mood stabilizer. Patient denies past trial of lithium and expresses interest in starting this medication.  Discussed risks of benzodiazepine use and counseled on the importance of not taking medications prescribed to others.  Patient is amenable to exploring alternative options for prn anxiolysis.  Identifies past benefit from gabapentin and is open to retrial.  Discussed recommended labs.  All questions/concerns addressed.  Past Psychiatric History:  Diagnoses: bipolar 2 disorder, anxiety, PTSD Suicide attempts: yes; x1 in high school via cutting Hospitalizations: denies; [facility] for hallucinations in setting of recent methamphetamine use Psychiatry: [facility] Therapy: expresses interest in engaging currently to manage negative self talk; build confidence; identify emotions  Review of CareEverywhere: -- Seen by family medicine [facility] 02/12/22: Charlyne Qualerx Caplyta  42 mg daily;  Gabapentin 300 mg TID; Trileptal 300 mg BID  Dispense report: Atarax 50 mg tabs QTY 60 06/25/22 PDMP: Klonopin 1 mg QTY 10 tabs 06/25/22  Previous Psychotropic Medications: Yes  - see above  Substance Abuse History in the last 12 months:  Yes.   Cannabis: smokes 4x times weekly; few puffs via vape Methamphetamine: last use in January 2023; had been using two times monthly Cocaine: last used 10 years ago Mushrooms: last used 39-23 yo; infrequent use at the time MDMA: last used [age] yo; every weekend at the time Xanax (used grandmother's): last used few nights ago; 0.25 mg 3-4 times a week when ran out of Klonopin.  Denies use of opioids or IVDU.  Consequences of Substance Abuse: Medical Consequences:  psychiatric symptoms and exacerbations  Past Medical History:  Past Medical History:  Diagnosis Date   Anxiety    Bipolar disorder [facility])    Medical history non-contributory     Past Surgical History:  Procedure Laterality Date   LEEP     NO PAST SURGERIES      Family Psychiatric History:  Mother: bipolar, schizophrenia, etoh use disorder, opioid use disorder Father: stimulant use Brother: opioid use disorder (recent overdose on fentanyl)   Family History:  Family History  Problem Relation Age of Onset   CAD Mother    Bipolar disorder Mother    Schizophrenia Mother    Alcohol abuse Mother    Drug abuse Father    Drug abuse Brother    Cancer Other    Hypertension Other    Diabetes Other     Social History:   Social History   Socioeconomic History   Marital status: Divorced    Spouse name: Not on file   Number of children: Not on file   Years of education: Not on file   Highest education level: Not on file  Occupational History   Not on file  Tobacco Use   Smoking status: Every Day    Packs/day: 0.50    Types: Cigarettes   Smokeless tobacco: Never  Vaping Use   Vaping Use: Never used  Substance and Sexual Activity   Alcohol use: Yes    Alcohol/week: 2.0  standard drinks of alcohol    Types: 2 Cans of beer per week   Drug use: Yes    Types: Marijuana    Comment: Currently smokes cannabis 4x weekly (vapes - few puffs per day)   Sexual activity: Yes  Other Topics Concern   Not on file  Social History Narrative   Was working at [employer] but let go in June 2023; currently unemployed   Social Determinants of Health   Financial Resource Strain: High Risk (09/03/2022)   Overall Financial Resource Strain (CARDIA)    Difficulty of Paying Living Expenses: Very hard  Food Insecurity: Food Insecurity Present (09/03/2022)   Hunger Vital Sign    Worried About Running Out of Food in the Last Year: Sometimes true    Ran Out of Food in the Last Year: Sometimes true  Transportation Needs: No Transportation Needs (09/03/2022)   PRAPARE - Administrator, Civil Service (Medical): No    Lack of Transportation (Non-Medical): No  Physical Activity: Insufficiently Active (09/03/2022)   Exercise Vital Sign    Days of Exercise per Week: 3 days    Minutes of Exercise per Session: 30 min  Stress: Stress Concern Present (09/03/2022)   [employer] of Occupational Health - Occupational  Stress Questionnaire    Feeling of Stress : Very much  Social Connections: Socially Isolated (09/03/2022)   Social Connection and Isolation Panel [NHANES]    Frequency of Communication with Friends and Family: Twice a week    Frequency of Social Gatherings with Friends and Family: Once a week    Attends Religious Services: Never    Database administrator or Organizations: No    Attends Engineer, structural: Never    Marital Status: Divorced    Additional Social History: updated  Allergies:  No Known Allergies  Current Medications: Current Outpatient Medications  Medication Sig Dispense Refill   busPIRone (BUSPAR) 10 MG tablet Take 10 mg by mouth 3 (three) times daily.     clonazePAM (KLONOPIN) 1 MG tablet Take 0.5 mg by mouth daily as  needed for anxiety.     gabapentin (NEURONTIN) 100 MG capsule Week 1: Take 1 tablet (100 mg) three times daily. / Week 2: If tolerating well, INCREASE to 2 tablets (200 mg) three times daily. / Week 3: If tolerating well, INCREASE to 3 tablets (300 mg) three times daily and remain at this dose. 200 capsule 0   lithium carbonate (LITHOBID) 300 MG CR tablet Take 300 mg nightly for 1 week then INCREASE to 300 mg twice daily. 60 tablet 1   norethindrone (AYGESTIN) 5 MG tablet Take 5 mg by mouth daily.     No current facility-administered medications for this visit.    ROS: Endorses diaphoresis; intermittent loose stools but not consistent; heart racing; unexpected weight loss Denies hair loss or dry skin; denies heat or cold intolerance  Objective:  Psychiatric Specialty Exam: unknown if currently breastfeeding.There is no height or weight on file to calculate BMI.  General Appearance: Casual and Well Groomed  Eye Contact:  Good  Speech:  Clear and Coherent and Normal Rate  Volume:  Normal  Mood:  Anxious  Affect:   Euthymic, full and appropriate range; mildly anxious  Thought Process:  Goal Directed and Linear  Orientation:  Full (Time, Place, and Person)  Thought Content:   Denies AVH and no overt delusional content on interview  Suicidal Thoughts:  No  Homicidal Thoughts:  No  Memory:   Grossly intact  Judgment:  Other:  Appears to be improving given reduction of substance use  Insight:  Fair  Psychomotor Activity:  Normal  Concentration:  Concentration: Grossly intact to interview  Recall:  NA  Fund of Knowledge:Good  Language: Good  Akathisia:  NA  Handed:  n/a  AIMS (if indicated):  not done  Assets:  Communication Skills Desire for Improvement Housing Leisure Time Physical Health Talents/Skills Transportation  ADL's:  Intact  Cognition: WNL  Sleep:   Fluctuates   PE: General: sits comfortably in view of camera; no acute distress  Pulm: no increased work of  breathing on room air  MSK: all extremity movements appear intact  Neuro: no focal neurological deficits observed  Gait & Station: unable to assess by video    Metabolic Disorder Labs: No results found for: "HGBA1C", "MPG" No results found for: "PROLACTIN" No results found for: "CHOL", "TRIG", "HDL", "CHOLHDL", "VLDL", "LDLCALC" No results found for: "TSH"  Therapeutic Level Labs: No results found for: "LITHIUM" No results found for: "CBMZ" No results found for: "VALPROATE"  Screenings:  GAD-7    Flowsheet Row Counselor from 09/03/2022 in [facility]  Total GAD-7 Score 21      PHQ2-9    Flowsheet  Row Counselor from 09/03/2022 in [facility]  PHQ-2 Total Score 4  PHQ-9 Total Score 18      Flowsheet Row Counselor from 09/03/2022 in [facility] ED from 12/31/2021 in [facility] EMERGENCY DEPARTMENT ED from 12/29/2021 in [facility]  C-SSRS RISK CATEGORY Low Risk No Risk No Risk       Collaboration of Care: Collaboration of Care: Medication Management AEB active medication changes and Psychiatrist AEB established with this provider  Patient/Guardian was advised Release of Information must be obtained prior to any record release in order to collaborate their care with an outside provider. Patient/Guardian was advised if they have not already done so to contact the registration department to sign all necessary forms in order for [place] to release information regarding their care.   Consent: Patient/Guardian gives verbal consent for treatment and assignment of benefits for services provided during this visit. Patient/Guardian expressed understanding and agreed to proceed.   Televisit via video: I connected with Veronica [name] on 09/03/22 at  3:00 PM EDT by a video enabled telemedicine application and verified that I am speaking with the correct person  using two identifiers.  Location: Patient: At home in [facility] Provider: remote in [place]   I discussed the limitations of evaluation and management by telemedicine and the availability of in person appointments. The patient expressed understanding and agreed to proceed.  I discussed the assessment and treatment plan with the patient. The patient was provided an opportunity to ask questions and all were answered. The patient agreed with the plan and demonstrated an understanding of the instructions.   The patient was advised to call back or seek an in-person evaluation if the symptoms worsen or if the condition fails to improve as anticipated.  I provided 120 minutes of non-face-to-face time during this encounter.  Jinnifer Montejano A Tamirah George 9/11/20235:22 PM
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
## 2022-09-03 NOTE — Patient Instructions (Addendum)
Thank you for attending your appointment today.  -- START lithium 300 mg nightly for 1 week with increase to 300 mg twice daily thereafter -- START gabapentin: Week 1: Take 100 mg three times daily Week 2: If tolerating well, INCREASE to 200 mg three times daily Week 3: If tolerating well, INCREASE to 300 mg three times daily and remain at this dose -- Continue buspirone as prescribed -- Please ask your PCP to check a TSH (thyroid test) during your visit this week. -- I have ordered a lithium level for you to get drawn before our next appointment. You will need to make sure you have been on the twice a day dosing for at least 5 full days before getting the lab draw. Please get the level drawn in the morning PRIOR to taking your morning lithium dose.   -- You can get the lab drawn at your nearest [facility] location: [email or web address] -- Finally, I am attaching a handout on lithium that reviews side effects and precautions. An important thing to be aware of is that you should not use medications classified as NSAIDs. If you need to take an over the counter medication for pain or headache, do NOT use medications such as Motrin/Aleve/ibuprofen and instead use Tylenol. Additionally, make sure to stay hydrated while taking this medication so levels do not build up.  Please do not make any changes to medications without first discussing with your provider. If you are experiencing a psychiatric emergency, please call 911 or present to your nearest emergency department. Additional crisis, medication management, and therapy resources are included below.  [facility]  [address] [place], [place] [address] [phone] or [phone] [facility] 24/7 FOR ANYONE [address] [place], [place]  [phone] Fax: [phone] [email or web address] *Interpreters available *Accepts all insurance and uninsured for Urgent Care  needs *Accepts Medicaid and uninsured for outpatient treatment (below)      ONLY FOR [facility]  Below:    Outpatient New Patient Assessment/Therapy Walk-ins:        Monday -Thursday 8am until slots are full.        Every Friday 1pm-4pm  (first come, first served)                   New Patient Psychiatry/Medication Management        Monday-Friday 8am-11am (first come, first served)               For all walk-ins we ask that you arrive by 7:15am, because patients will be seen in the order of arrival.

## 2022-09-04 ENCOUNTER — Encounter (HOSPITAL_COMMUNITY): Payer: Self-pay

## 2022-09-04 DIAGNOSIS — F1591 Other stimulant use, unspecified, in remission: Secondary | ICD-10-CM | POA: Insufficient documentation

## 2022-09-04 NOTE — Plan of Care (Signed)
  Problem: Bipolar Disorder CCP Problem  1  Goal: LTG: Stabilize mood and increase goal-directed behavior AEB self-report  Outcome: Initial Goal: STG: Veronica [name] will increase stability in moods AEB development of x 3 effective coping skills with ability to reframe distorted thinking 7/7 days within the next 6 months  Outcome: Initial Goal: STG: Veronica [name] will increase stability in moods AEB daily medication compliance with development of daily routine within the next 6 months  Outcome: Initial   Problem: Anxiety Disorder CCP Problem  1 gad Goal: LTG: Veronica [name] will score less than 5 on the Generalized Anxiety Disorder 7 Scale (GAD-7) Outcome: Initial Goal: STG: Veronica [name] will increase management of anxiety AEB development of x 3 effective grounding/relaxation and distress tolerance coping skills within the next 6 months  Outcome: Initial   

## 2022-09-05 DIAGNOSIS — M2569 Stiffness of other specified joint, not elsewhere classified: Secondary | ICD-10-CM | POA: Diagnosis not present

## 2022-09-05 DIAGNOSIS — E7800 Pure hypercholesterolemia, unspecified: Secondary | ICD-10-CM | POA: Diagnosis not present

## 2022-09-05 DIAGNOSIS — F39 Unspecified mood [affective] disorder: Secondary | ICD-10-CM | POA: Diagnosis not present

## 2022-09-05 DIAGNOSIS — Z0000 Encounter for general adult medical examination without abnormal findings: Secondary | ICD-10-CM | POA: Diagnosis not present

## 2022-09-05 DIAGNOSIS — Z79899 Other long term (current) drug therapy: Secondary | ICD-10-CM | POA: Diagnosis not present

## 2022-09-05 DIAGNOSIS — Z23 Encounter for immunization: Secondary | ICD-10-CM | POA: Diagnosis not present

## 2022-09-17 ENCOUNTER — Ambulatory Visit (INDEPENDENT_AMBULATORY_CARE_PROVIDER_SITE_OTHER): Payer: Medicaid Other | Admitting: Mental Health

## 2022-09-17 DIAGNOSIS — F419 Anxiety disorder, unspecified: Secondary | ICD-10-CM

## 2022-09-17 DIAGNOSIS — F39 Unspecified mood [affective] disorder: Secondary | ICD-10-CM

## 2022-09-17 NOTE — Progress Notes (Signed)
   THERAPIST PROGRESS NOTE  Session Time: 11:05am ( 50 minutes)   Participation Level: Active  Behavioral Response: CasualAlertDysphoric  Type of Therapy: Individual Therapy  Treatment Goals addressed: Veronica [name] will increase stability in moods AEB development of x 3 effective coping skills with ability to reframe distorted thinking 7/7 days within the next 6  Veronica [name] will increase stability in moods AEB daily medication compliance with development of daily routine within the next 6 months   STG: Veronica [name] will increase management of anxiety AEB development of x 3 effective grounding/relaxation and distress tolerance coping skills within the next 6   ProgressTowards Goals: Initial  Interventions: CBT and Supportive  Summary: Veronica [name] is a 36 y.o. female who presents with dx of unspecified mood disorder and anxiety. Veronica [name] presents alert and oriented; mood and affect depressed. Tearful on and off throughout session. Veronica [name] shares to feel as if she is doing somewhat better and shares to be working on trying to do things for herself taking showers, sleeping more. Report to have decided to allow youngest son to live with his father since unable to gain increased child support, shares mixed feelings concerning this. Reports stressor of being denied unemployment and reports has been looking for work but not hearing call backs. Shares to spend times working on art in which she enjoys and looking for employment. Reports difficulty managing emotions. Shares to support in care taking for grand-father who has dementia as nanny who lives down the road. Reports to feel as if medication was not beneficial and ceased taking. Thoughts of concerns with her hormones resulting in contribution to mood instability. Agrees to work to increase awareness of emotions and events that trigger big emotions. Reports some coping skills in place of [id number] and taking a shower. No safety concerns reported.    Suicidal/Homicidal: Nowithout intent/plan  Therapist Response: Therapist engaged Veronica [name] in therapy session, completed check in and assessed for current level of functioning sxs management and level of stressors. Provided safe space for Veronica [name] to share thoughts and feelings concerning stressors and life changes. Provided support and encouragement; validated feelings. Engaged Veronica [name] in exploring factors contributing to 'big emotions' and ability to cope. Explored presence of medication compliance and encouraged to follow up with OBGYN for concerns with hormones. Supported Veronica [name] in processing ability to engage in balance of self-care with working to support family members as needed. Processed ability to work to identify thoughts and situations that lead to big feelings and working to engage in alternative thoughts to support in ability to navigate difficulty emotions. Encouraged follow up with medication provider. Encouraged follow up with [facility] for supported employment services. Reviewed session and provided follow up  Plan: Return again in  x 2 weeks.  Diagnosis: Unspecified mood (affective) disorder ([place])  Anxiety  Collaboration of Care: Other Follow up with supported employment and PCP   Patient/Guardian was advised Release of Information must be obtained prior to any record release in order to collaborate their care with an outside provider. Patient/Guardian was advised if they have not already done so to contact the registration department to sign all necessary forms in order for [place] to release information regarding their care.   Consent: Patient/Guardian gives verbal consent for treatment and assignment of benefits for services provided during this visit. Patient/Guardian expressed understanding and agreed to proceed.   Rockey Situ [place], [facility] 09/17/2022

## 2022-09-17 NOTE — Plan of Care (Signed)
  Problem: Bipolar Disorder CCP Problem  1  Goal: LTG: Stabilize mood and increase goal-directed behavior AEB self-report  Outcome: Initial Goal: STG: [place] will increase stability in moods AEB development of x 3 effective coping skills with ability to reframe distorted thinking 7/7 days within the next 6 months  Outcome: Initial Goal: STG: Odetta will increase stability in moods AEB daily medication compliance with development of daily routine within the next 6 months  Outcome: Initial   Problem: Anxiety Disorder CCP Problem  1 gad Goal: LTG: Darrick Meigs will score less than 5 on the Generalized Anxiety Disorder 7 Scale (GAD-7) Outcome: Initial Goal: STG: Christian will increase management of anxiety AEB development of x 3 effective grounding/relaxation and distress tolerance coping skills within the next 6 months  Outcome: Initial

## 2022-09-30 NOTE — Progress Notes (Unsigned)
BH MD Outpatient Progress Note  10/01/2022 12:09 PM Veronica [name]  MRN:  [id number]  Assessment:  Veronica [name] presents for follow-up evaluation. Today, 10/01/22, patient reports she only took lithium and gabapentin for 3 days and 1 day, respectively due to side effects and minimal benefit. Patient has history of numerous medication trials and was counseled on importance of pursuing full trial before aborting medication barring significant side effects. She is currently only using Klonopin 0.5 mg PRN (about 3 times monthly) from script remaining from previous psychiatric provider. At this time, it appears patient's symptoms are most likely anxiety and trauma-driven rather than manifestation of bipolar illness; to better clarify will start prazosin as below and assess response for overall symptom profile. If effective, patient may benefit from revisiting SSRI or trialing SNRI (with careful monitoring of mood symptoms) to better target anxiety.   Plan to RTC in 4 weeks.   Identifying Information: Veronica [name] is a 36 y.o. female with a historical diagnosis of bipolar disorder, anxiety, substance use, and endometriosis who is an established patient with [facility] participating in follow-up via video conferencing. On initial evaluation, patient reported receiving initial diagnosis of bipolar 2 as an adolescent however noted that this was during a period of concurrent substance use and endorsed intermittent substance use up until January of this year (last use of methamphetamines) along with continued small amount of cannabis use weekly.  Patient did endorse episodes of mood fluctuations independent of substance use that raised concern for hypomania however has denied history of depressive episodes warranting further evaluation. Patient does report significant anxiety as well as trauma related symptoms consistent with PTSD that may better explain symptoms of mood  fluctuations, irritability, and racing thoughts.  Plan:  # Unspecified mood disorder, r/o bipolar spectrum illness Past medication trials: Depakote (brain fogging); Trileptal 300 mg BID (ineffective); Lihtum (only took for 3 days - reports irritability and impaired cognition); Seroquel (sleepwalking, disorientation, AH); Vraylar 6 mg daily (somewhat effective but not affordable); Latuda (jittery); lumateperone (oversedation); Abilify (worsening mood lability)  Status of problem: New problem to this provider Interventions: -- Target anxiety and trauma-related symptoms below and assess response to guide medication management -- Continue individual psychotherapy with Stephan Minister, [facility]   # Anxiety  PTSD Past medication trials: propranolol (dizziness, decreased BP); gabapentin 200 mg TID (effective for intensity of panic attacks but still persisted); prazosin (only slightly reduced nightmares); hydroxyzine; Zoloft (sexual side effects); Celexa (sexual side effects), Prozac (unknown effect); WBT (jittery); Buspar (ineffective) Status of problem: New problem to this provider Interventions: -- START Prazosin 1 mg nightly; after 3-4 days increase to 1 mg BID -- Patient filled Klonopin 1 mg QTY 10 on 09/03/22 prescribed by prior psychiatrist; discussed only filling scripts from this provider; reports using 0.5 mg three times this past month   # History of stimulant use (most recently Jan 2023) Status of problem: Improving Interventions: -- Continue to monitor and encourage sustained abstinence   Patient was given contact information for behavioral health clinic and was instructed to call 911 for emergencies.   Subjective:  Chief Complaint:  Chief Complaint  Patient presents with   Medication Management    Interval History:   Reports she attempted lithium but it "wasn't good" as family members told her she was distant and moody and feels it affected her cognition. Stopped after 3 days.  Tried gabapentin for one day - made her feel sluggish but didn't help with anxiety. Stopped taking  Buspar. Has used Klonopin 0.5 mg about 3 times this interval for severe anxiety. Discussed importance of allowing medications full trial to fully assess benefit barring significant side effects. She endorses continued significant anxiety related to familial, interpersonal, financial, and employment stressors. She feels that anxiety and ruminative thoughts remain her primary issue although also notes worsening irritability that she feels may be related to hormonal imbalance (on Aygestin for endometriosis) as she has also been experiencing hot flashes, increased sensitivity, abd cramping. Appt with Ob/Gyn next week to discuss this.   Denies elevated mood or grandiosity. Experiences "low moments" that may last about 2 days. Denies history of depression lasting 2 weeks.   Reviewed past medication trials. Discussed conceptualization of anxiety/trauma vs. Ruling out bipolar spectrum illness. She reports history of being on unopposed SSRI without manic switch (although stopped due to other side effects).  Has been on unopposed Zoloft (sexual side effects), Celexa (sexual side effects), Prozac, WBT alone (jittery) - experienced GI upset but denies manic switch.  Introduced options of revisiting SSRI/starting SNRI vs. Retrialing Prazosin with daytime dosing to target daytime hyperarousal symptoms and nightmares. She opted to start Prazosin; risks/benefits including dizziness, sedation, and decreased BP were reviewed.   Visit Diagnosis:    ICD-10-CM   1. Unspecified mood (affective) disorder (HCC)  F39     2. PTSD (post-traumatic stress disorder)  F43.10     3. Anxiety  F41.9       Past Psychiatric History:  Diagnoses: bipolar 2 disorder, anxiety, PTSD Suicide attempts: yes; x1 in high school via cutting Hospitalizations: denies; presented to [facility] for hallucinations in setting of recent  methamphetamine use Psychiatry: previously seen at [facility] Therapy: expresses interest in engaging currently to manage negative self talk; build confidence; identify emotions Substance use:  -- Cannabis: smokes D8 4x times weekly; few puffs via vape  -- Xanax (used grandmother's): last used 1 month ago; 0.25 mg 3-4 times a week when ran out of Klonopin  -- Methamphetamine: last use in January 2023; had been using two times monthly -- Remote use of cocaine, mushrooms, MDMA  Past Medical History:  Past Medical History:  Diagnosis Date   Anxiety    Bipolar disorder (HCC)    Medical history non-contributory     Past Surgical History:  Procedure Laterality Date   LEEP     NO PAST SURGERIES      Family Psychiatric History:  Mother: bipolar, schizophrenia, etoh use disorder, opioid use disorder Father: stimulant use Brother: opioid use disorder (recent overdose on fentanyl)  Family History:  Family History  Problem Relation Age of Onset   CAD Mother    Bipolar disorder Mother    Schizophrenia Mother    Alcohol abuse Mother    Drug abuse Father    Drug abuse Brother    Cancer Other    Hypertension Other    Diabetes Other     Social History:  Social History   Socioeconomic History   Marital status: Divorced    Spouse name: Not on file   Number of children: Not on file   Years of education: Not on file   Highest education level: Not on file  Occupational History   Not on file  Tobacco Use   Smoking status: Every Day    Packs/day: 0.50    Types: Cigarettes   Smokeless tobacco: Never  Vaping Use   Vaping Use: Never used  Substance and Sexual Activity   Alcohol use: Yes  Alcohol/week: 2.0 standard drinks of alcohol    Types: 2 Cans of beer per week   Drug use: Yes    Types: Marijuana    Comment: Currently smokes cannabis 4x weekly (vapes - few puffs per day)   Sexual activity: Yes  Other Topics Concern   Not on file  Social History Narrative   Was working at  [employer] but let go in June 2023; currently unemployed.   Social Determinants of Health   Financial Resource Strain: High Risk (09/03/2022)   Overall Financial Resource Strain (CARDIA)    Difficulty of Paying Living Expenses: Very hard  Food Insecurity: Food Insecurity Present (09/03/2022)   Hunger Vital Sign    Worried About Running Out of Food in the Last Year: Sometimes true    Ran Out of Food in the Last Year: Sometimes true  Transportation Needs: No Transportation Needs (09/03/2022)   PRAPARE - Administrator, Civil Service (Medical): No    Lack of Transportation (Non-Medical): No  Physical Activity: Insufficiently Active (09/03/2022)   Exercise Vital Sign    Days of Exercise per Week: 3 days    Minutes of Exercise per Session: 30 min  Stress: Stress Concern Present (09/03/2022)   [employer] of Occupational Health - Occupational Stress Questionnaire    Feeling of Stress : Very much  Social Connections: Socially Isolated (09/03/2022)   Social Connection and Isolation Panel [NHANES]    Frequency of Communication with Friends and Family: Twice a week    Frequency of Social Gatherings with Friends and Family: Once a week    Attends Religious Services: Never    Database administrator or Organizations: No    Attends Engineer, structural: Never    Marital Status: Divorced    Allergies: No Known Allergies  Current Medications: Current Outpatient Medications  Medication Sig Dispense Refill   clonazePAM (KLONOPIN) 1 MG tablet Take 0.5 mg by mouth daily as needed for anxiety.     norethindrone (AYGESTIN) 5 MG tablet Take 5 mg by mouth daily.     prazosin (MINIPRESS) 1 MG capsule Take 1 capsule (1 mg total) nightly. After 3-4 days, increase to 1 capsule twice daily. 60 capsule 1   busPIRone (BUSPAR) 10 MG tablet Take 10 mg by mouth 3 (three) times daily. (Patient not taking: Reported on 10/01/2022)     gabapentin (NEURONTIN) 100 MG capsule Week 1: Take  1 tablet (100 mg) three times daily. / Week 2: If tolerating well, INCREASE to 2 tablets (200 mg) three times daily. / Week 3: If tolerating well, INCREASE to 3 tablets (300 mg) three times daily and remain at this dose. (Patient not taking: Reported on 10/01/2022) 200 capsule 0   lithium carbonate (LITHOBID) 300 MG CR tablet Take 300 mg nightly for 1 week then INCREASE to 300 mg twice daily. (Patient not taking: Reported on 10/01/2022) 60 tablet 1   No current facility-administered medications for this visit.    ROS: Endorses hot flashes, abd cramping.   Objective:  Psychiatric Specialty Exam: unknown if currently breastfeeding.There is no height or weight on file to calculate BMI.  General Appearance: Casual and Well Groomed  Eye Contact:  Good  Speech:  Clear and Coherent and Normal Rate  Volume:  Normal  Mood:   "anxious"  Affect:  Appropriate, Congruent, and Anxious but engaged and full range  Thought Content:  Denies AVH, IOR; no overt delusional content    Suicidal Thoughts:  No  Homicidal Thoughts:  No  Thought Process:  Goal Directed and Linear  Orientation:  Full (Time, Place, and Person)    Memory:   Grossly intact  Judgment:  Fair  Insight:  Fair  Concentration:  Concentration: Good  Recall:  NA  Fund of Knowledge: Good  Language: Good  Psychomotor Activity:  Normal  Akathisia:  NA  AIMS (if indicated): not done  Assets:  Communication Skills Desire for Improvement Housing Leisure Time Physical Health Social Support Talents/Skills  ADL's:  Intact  Cognition: WNL  Sleep:  Poor   PE: General: sits comfortably in view of camera; no acute distress  Pulm: no increased work of breathing on room air  MSK: all extremity movements appear intact  Neuro: no focal neurological deficits observed  Gait & Station: unable to assess by video    Metabolic Disorder Labs: No results found for: "HGBA1C", "MPG" No results found for: "PROLACTIN" No results found for:  "CHOL", "TRIG", "HDL", "CHOLHDL", "VLDL", "LDLCALC" No results found for: "TSH"  Therapeutic Level Labs: No results found for: "LITHIUM" No results found for: "VALPROATE" No results found for: "CBMZ"  Screenings:  GAD-7    Flowsheet Row Counselor from 09/03/2022 in [facility]  Total GAD-7 Score 21      PHQ2-9    Flowsheet Row Counselor from 09/03/2022 in [facility]  PHQ-2 Total Score 4  PHQ-9 Total Score 18      Flowsheet Row Counselor from 09/03/2022 in [facility] ED from 12/31/2021 in [facility] from 12/29/2021 in [facility]  C-SSRS RISK CATEGORY Low Risk No Risk No Risk       Collaboration of Care: Collaboration of Care: Medication Management AEB active medication changes, Psychiatrist AEB established with this provider, and Other provider involved in patient's care AEB established with individual psychotherapist  Patient/Guardian was advised Release of Information must be obtained prior to any record release in order to collaborate their care with an outside provider. Patient/Guardian was advised if they have not already done so to contact the registration department to sign all necessary forms in order for [place] to release information regarding their care.   Consent: Patient/Guardian gives verbal consent for treatment and assignment of benefits for services provided during this visit. Patient/Guardian expressed understanding and agreed to proceed.   Televisit via video: I connected with patient on 10/01/22 at 11:00 AM EDT by a video enabled telemedicine application and verified that I am speaking with the correct person using two identifiers.  Location: Patient: in car (not driving) in [place], [place] Provider: remote office in [place]   I discussed the limitations of evaluation and management by telemedicine and the  availability of in person appointments. The patient expressed understanding and agreed to proceed.  I discussed the assessment and treatment plan with the patient. The patient was provided an opportunity to ask questions and all were answered. The patient agreed with the plan and demonstrated an understanding of the instructions.   The patient was advised to call back or seek an in-person evaluation if the symptoms worsen or if the condition fails to improve as anticipated.  I provided 45 minutes of non-face-to-face time during this encounter.  Tay Whitwell A Opel Lejeune 10/01/2022, 12:09 PM

## 2022-10-01 ENCOUNTER — Telehealth (INDEPENDENT_AMBULATORY_CARE_PROVIDER_SITE_OTHER): Payer: Medicaid Other | Admitting: Psychiatry

## 2022-10-01 ENCOUNTER — Encounter (HOSPITAL_COMMUNITY): Payer: Self-pay | Admitting: Psychiatry

## 2022-10-01 DIAGNOSIS — F419 Anxiety disorder, unspecified: Secondary | ICD-10-CM | POA: Diagnosis not present

## 2022-10-01 DIAGNOSIS — F4310 Post-traumatic stress disorder, unspecified: Secondary | ICD-10-CM | POA: Diagnosis not present

## 2022-10-01 DIAGNOSIS — F39 Unspecified mood [affective] disorder: Secondary | ICD-10-CM | POA: Diagnosis not present

## 2022-10-01 MED ORDER — PRAZOSIN HCL 1 MG PO CAPS: ORAL_CAPSULE | ORAL | 1 refills | Status: DC

## 2022-10-01 NOTE — Patient Instructions (Signed)
Thank you for attending your appointment today.  -- START Prazosin 1 mg nightly; after 3-4 days increase to 1 mg twice daily. -- Continue other medications as prescribed.  Please do not make any changes to medications without first discussing with your provider. If you are experiencing a psychiatric emergency, please call 911 or present to your nearest emergency department. Additional crisis, medication management, and therapy resources are included below.  [facility]  [address], [place], [place] [address] [phone] or [phone] [facility] 24/7 FOR ANYONE [address] [place], [place] Fax: [phone] [email or web address] *Interpreters available *Accepts all insurance and uninsured for Urgent Care needs *Accepts Medicaid and uninsured for outpatient treatment (below)      ONLY FOR [facility]  Below:    Outpatient New Patient Assessment/Therapy Walk-ins:        Monday -Thursday 8am until slots are full.        Every Friday 1pm-4pm  (first come, first served)                   New Patient Psychiatry/Medication Management        Monday-Friday 8am-11am (first come, first served)               For all walk-ins we ask that you arrive by 7:15am, because patients will be seen in the order of arrival.

## 2022-10-04 ENCOUNTER — Ambulatory Visit (HOSPITAL_COMMUNITY): Payer: Medicaid Other | Admitting: Mental Health

## 2022-10-04 ENCOUNTER — Telehealth (HOSPITAL_COMMUNITY): Payer: Self-pay | Admitting: *Deleted

## 2022-10-04 DIAGNOSIS — F4310 Post-traumatic stress disorder, unspecified: Secondary | ICD-10-CM

## 2022-10-04 NOTE — Telephone Encounter (Signed)
Patient LVM to Cancel Appt

## 2022-10-04 NOTE — Progress Notes (Signed)
Therapist sent link for virtual appointment through caregility. No response. Therapist called pt. With no answer. Left message to contact office to reschedule appointment.

## 2022-10-10 DIAGNOSIS — R109 Unspecified abdominal pain: Secondary | ICD-10-CM | POA: Diagnosis not present

## 2022-10-10 DIAGNOSIS — Z0000 Encounter for general adult medical examination without abnormal findings: Secondary | ICD-10-CM | POA: Diagnosis not present

## 2022-10-10 DIAGNOSIS — N809 Endometriosis, unspecified: Secondary | ICD-10-CM | POA: Diagnosis not present

## 2022-10-26 ENCOUNTER — Ambulatory Visit (HOSPITAL_COMMUNITY): Payer: Medicaid Other | Admitting: Mental Health

## 2022-10-29 ENCOUNTER — Encounter (HOSPITAL_COMMUNITY): Payer: Medicaid Other | Admitting: Psychiatry

## 2022-10-29 ENCOUNTER — Telehealth (HOSPITAL_COMMUNITY): Payer: Medicaid Other | Admitting: Psychiatry

## 2022-10-29 ENCOUNTER — Encounter (HOSPITAL_COMMUNITY): Payer: Self-pay

## 2022-10-29 NOTE — Progress Notes (Signed)
Patient connected for virtual psychiatric medication management appointment on 10/29/22 however reported she unexpectedly had to drive somewhere and would not be able to do appointment. States she will call to reschedule to later date. Denies any urgent concerns/issues at this time.   Alda Berthold, MD 11/02/22

## 2022-11-08 ENCOUNTER — Telehealth (HOSPITAL_COMMUNITY): Payer: Self-pay | Admitting: Mental Health

## 2022-11-08 ENCOUNTER — Ambulatory Visit (HOSPITAL_COMMUNITY): Payer: Medicaid Other | Admitting: Mental Health

## 2022-11-08 NOTE — Telephone Encounter (Signed)
Pt failed to [employer] [employer] tele-therapy session after therapist sent link. No response after x 10 minutes. Therapist contacted pt via telephone, no answer. Left message to reschedule if interested in therapy services.

## 2022-12-06 ENCOUNTER — Other Ambulatory Visit (HOSPITAL_COMMUNITY): Payer: Self-pay | Admitting: Psychiatry

## 2023-01-08 DIAGNOSIS — M79631 Pain in right forearm: Secondary | ICD-10-CM | POA: Diagnosis not present

## 2023-01-08 DIAGNOSIS — S5011XA Contusion of right forearm, initial encounter: Secondary | ICD-10-CM | POA: Diagnosis not present

## 2023-01-08 DIAGNOSIS — S300XXA Contusion of lower back and pelvis, initial encounter: Secondary | ICD-10-CM | POA: Diagnosis not present

## 2023-01-25 DIAGNOSIS — N809 Endometriosis, unspecified: Secondary | ICD-10-CM | POA: Diagnosis not present

## 2023-03-12 ENCOUNTER — Encounter (HOSPITAL_BASED_OUTPATIENT_CLINIC_OR_DEPARTMENT_OTHER): Payer: Self-pay | Admitting: Obstetrics and Gynecology

## 2023-03-12 ENCOUNTER — Other Ambulatory Visit: Payer: Self-pay

## 2023-03-12 NOTE — Progress Notes (Signed)
Your procedure is scheduled on Wednesday, 03/20/2023.  Report to [facility] AT  5:30 AM.   Call this number if you have problems the morning of surgery  :[phone].   OUR ADDRESS IS [address].  WE [facility] LOCATED IN [facility].  PLEASE BRING YOUR INSURANCE CARD AND PHOTO ID DAY OF SURGERY.  ONLY 2 PEOPLE ARE ALLOWED IN  WAITING  ROOM / CURRENTLY NO ONE UNDER AGE 35                                     REMEMBER:  DO NOT EAT FOOD, CANDY GUM OR MINTS  AFTER MIDNIGHT THE NIGHT BEFORE YOUR SURGERY . YOU MAY HAVE CLEAR LIQUIDS FROM MIDNIGHT THE NIGHT BEFORE YOUR SURGERY UNTIL  4:30 AM. NO CLEAR LIQUIDS AFTER   4:30 AM DAY OF SURGERY.  YOU MAY  BRUSH YOUR TEETH MORNING OF SURGERY AND RINSE YOUR MOUTH OUT, NO CHEWING GUM CANDY OR MINTS.     CLEAR LIQUID DIET    Allowed      Water                                                                   Coffee and tea, regular and decaf  (NO cream or milk products of any type, may sweeten)                         Carbonated beverages, regular and diet                                    Sports drinks like Gatorade _____________________________________________________________________     TAKE ONLY THESE MEDICATIONS MORNING OF SURGERY: NONE    UP TO 4 VISITORS  MAY VISIT IN THE EXTENDED RECOVERY ROOM UNTIL 800 PM ONLY.  ONE  VISITOR AGE 62 AND OVER MAY SPEND THE NIGHT AND MUST BE IN EXTENDED RECOVERY ROOM NO LATER THAN 800 PM . YOUR DISCHARGE TIME AFTER YOU SPEND THE NIGHT IS 900 AM THE MORNING AFTER YOUR SURGERY.  YOU MAY PACK A SMALL OVERNIGHT BAG WITH TOILETRIES FOR YOUR OVERNIGHT STAY IF YOU WISH.  YOUR PRESCRIPTION MEDICATIONS WILL BE PROVIDED DURING [place].                                      DO NOT WEAR JEWERLY/  METAL/  PIERCINGS (INCLUDING NO PLASTIC PIERCINGS) DO NOT WEAR LOTIONS, POWDERS, PERFUMES OR NAIL POLISH ON YOUR FINGERNAILS. TOENAIL POLISH IS OK TO WEAR. DO NOT  SHAVE FOR 48 HOURS PRIOR TO DAY OF SURGERY.  CONTACTS, GLASSES, OR DENTURES MAY NOT BE WORN TO SURGERY.  REMEMBER: NO SMOKING, VAPING ,  DRUGS OR ALCOHOL FOR 24 HOURS BEFORE YOUR SURGERY.                                    [facility]  IS NOT RESPONSIBLE  FOR ANY BELONGINGS.                                                                    Marland Kitchen            - Preparing for Surgery Before surgery, you can play an important role.  Because skin is not sterile, your skin needs to be as free of germs as possible.  You can reduce the number of germs on your skin by washing with CHG (chlorahexidine gluconate) soap before surgery.  CHG is an antiseptic cleaner which kills germs and bonds with the skin to continue killing germs even after washing. Please DO NOT use if you have an allergy to CHG or antibacterial soaps.  If your skin becomes reddened/irritated stop using the CHG and inform your nurse when you arrive at Short Stay. Do not shave (including legs and underarms) for at least 48 hours prior to the first CHG shower.  You may shave your face/neck. Please follow these instructions carefully:  1.  Shower with CHG Soap the night before surgery and the  morning of Surgery.  2.  If you choose to wash your hair, wash your hair first as usual with your  normal  shampoo.  3.  After you shampoo, rinse your hair and body thoroughly to remove the  shampoo.                                        4.  Use CHG as you would any other liquid soap.  You can apply chg directly  to the skin and wash , chg soap provided, night before and morning of your surgery.  5.  Apply the CHG Soap to your body ONLY FROM THE NECK DOWN.   Do not use on face/ open                           Wound or open sores. Avoid contact with eyes, ears mouth and genitals (private parts).                       Wash face,  Genitals (private parts) with your normal soap.             6.  Wash thoroughly, paying special attention to the area  where your surgery  will be performed.  7.  Thoroughly rinse your body with warm water from the neck down.  8.  DO NOT shower/wash with your normal soap after using and rinsing off  the CHG Soap.             9.  Pat yourself dry with a clean towel.            10.  Wear clean pajamas.            11.  Place clean sheets on your bed the night of your first shower and do not  sleep with pets. Day of Surgery : Do not apply any lotions/ powders the morning of surgery.  Please wear clean clothes to the hospital/surgery center.  IF YOU HAVE ANY SKIN IRRITATION OR PROBLEMS WITH THE SURGICAL SOAP, PLEASE GET A BAR OF GOLD DIAL SOAP AND SHOWER THE NIGHT BEFORE YOUR SURGERY AND THE MORNING OF YOUR SURGERY. PLEASE LET THE NURSE KNOW MORNING OF YOUR SURGERY IF YOU HAD ANY PROBLEMS WITH THE SURGICAL SOAP.   ________________________________________________________________________                                                        QUESTIONS [facility] PRE OP NURSE PHONE [phone].

## 2023-03-12 NOTE — Progress Notes (Signed)
Spoke w/ via phone for pre-op interview---Veronica [name] needs dos---- urine pregnancy              [name] results------03/14/2023 [name] appt for cbc, type & screen COVID test -----patient states asymptomatic no test needed Arrive at -------0530 on Wednesday, 03/20/2023 NPO after MN NO Solid Food.  Clear liquids from MN until---0430 Med rec completed Medications to take morning of surgery -----none Diabetic medication -----n/a Patient instructed no nail polish to be worn day of surgery Patient instructed to bring photo id and insurance card day of surgery Patient aware to have Driver (ride ) / caregiver    for 24 hours after surgery - Aunt -  Veronica [name] Patient Special Instructions -----Extended / overnight stay instructions given. Do your best not to smoke for 24 hours before surgery. Pre-Op special Istructions -----none Patient verbalized understanding of instructions that were given at this phone interview. Patient denies shortness of breath, chest pain, fever, cough at this phone interview.

## 2023-03-14 ENCOUNTER — Encounter (HOSPITAL_COMMUNITY)
Admission: RE | Admit: 2023-03-14 | Discharge: 2023-03-14 | Disposition: A | Discharge status: Home / Self Care | Payer: Medicaid Other | Source: Ambulatory Visit | Attending: Obstetrics and Gynecology | Admitting: Obstetrics and Gynecology

## 2023-03-14 DIAGNOSIS — Z01818 Encounter for other preprocedural examination: Secondary | ICD-10-CM

## 2023-03-14 DIAGNOSIS — Z01812 Encounter for preprocedural laboratory examination: Secondary | ICD-10-CM | POA: Insufficient documentation

## 2023-03-14 LAB — CBC
HCT: 43.8 % (ref 36.0–46.0)
Hemoglobin: 14.6 g/dL (ref 12.0–15.0)
MCH: 31.5 pg (ref 26.0–34.0)
MCHC: 33.3 g/dL (ref 30.0–36.0)
MCV: 94.4 fL (ref 80.0–100.0)
Platelets: 321 10*3/uL (ref 150–400)
RBC: 4.64 MIL/uL (ref 3.87–5.11)
RDW: 12.7 % (ref 11.5–15.5)
WBC: 10.3 10*3/uL (ref 4.0–10.5)
nRBC: 0 % (ref 0.0–0.2)

## 2023-03-17 DIAGNOSIS — N809 Endometriosis, unspecified: Secondary | ICD-10-CM | POA: Diagnosis not present

## 2023-03-19 NOTE — Anesthesia Preprocedure Evaluation (Signed)
Anesthesia Evaluation  Patient identified by MRN, date of birth, ID band Patient awake    Reviewed: Allergy & Precautions, NPO status , Patient's Chart, lab work & pertinent test results  History of Anesthesia Complications Negative for: history of anesthetic complications  Airway Mallampati: I  TM Distance: >3 FB Neck ROM: Full    Dental  (+) Chipped   Pulmonary Current SmokerPatient did not abstain from smoking.   Pulmonary exam normal        Cardiovascular negative cardio ROS Normal cardiovascular exam     Neuro/Psych  PSYCHIATRIC DISORDERS Anxiety  Bipolar Disorder    PTSD negative neurological ROS     GI/Hepatic negative GI ROS,,,(+)     substance abuse (hx amphetamine use)  marijuana use  Endo/Other  negative endocrine ROS    Renal/GU negative Renal ROS     Musculoskeletal  (+) Arthritis ,    Abdominal   Peds  Hematology negative hematology ROS (+)   Anesthesia Other Findings   Reproductive/Obstetrics                             Anesthesia Physical Anesthesia Plan  ASA: 2  Anesthesia Plan: General   Post-op Pain Management: Tylenol PO (pre-op)* and Celebrex PO (pre-op)*   Induction: Intravenous  PONV Risk Score and Plan: 2 and Treatment may vary due to age or medical condition, Ondansetron, Dexamethasone, Midazolam and Scopolamine patch - Pre-op  Airway Management Planned: Oral ETT  Additional Equipment: None  Intra-op Plan:   Post-operative Plan: Extubation in OR  Informed Consent: I have reviewed the patients History and Physical, chart, labs and discussed the procedure including the risks, benefits and alternatives for the proposed anesthesia with the patient or authorized representative who has indicated his/her understanding and acceptance.     Dental advisory given  Plan Discussed with: CRNA and Anesthesiologist  Anesthesia Plan Comments:         Anesthesia Quick Evaluation

## 2023-03-19 NOTE — H&P (Signed)
Veronica [name] is an 37 y.o. female P2 with chronic pelvic pain, dysmenorrhea, suspected endometriosis.  08/07/21: "Abdominal pain before and after her periods, worse since her 35s. Starts a day or 2 before period, stops at the middle or end of her period. Gets to 8-9/10 (if 10 is childbirth). Tried TXA, which helped a little. Heat pack helps a little. Has tried ibuprofen, helps somewhat. Has tried OCPs, nexplanon, nuvaring, IUD. Heavy first 2 days, last 5-7 days, may stop on day 4 and then return day 6-7. Has not had scan since pregnancy 2015"  08/29/21 Pelvic scan: uterus anteverted 8.2cm, endo 64mm, right ovary 3.5cm normal appearing, left ovary 3.1cm normal appearing. Normal gyn scan "Dysmenorrhea - reviewed normal GYN [place], no evidence of fibroids or adenomyosis. Discussed cannot r/o endometriosis with ultrasound. Symptoms most consistent with endometriosis at this point. She has tried Junel in the past but did not take it continuously. We discussed options: continue with NSAIDs, trial of continuous COC, trial of GnRH agonist/antagonist, diagnostic laparoscopy. She would like to trial continuous COC before trying [place] ag/antag and would like to avoid diagnostic lap."  10/19/21; Chronic dysmenorrhea: has failed mgmt with OCPs, nexplanon, nuvaring, IUD. Reviewed mgmt options ranging from hormonal with aygestin, GnRH agonist/antagonist to surgical with endometrial ablation or hysterectomy. She is now strongly considering hysterectomy as definitive management. She does not want to do endometrial ablation and then still end up with hysterectomy. Would recommend RA-TLH, salpingectomy, cystoscopy, possible resection of endometriosis, possible bilateral or unilateral oophorectomy.   01/25/23: reasonable control of pelvic pain on norethindrone 5mg  BID but side effects including mood changes, hot flashes, nausea. Pelvic pain returns if reduces to 5mg  daily. Has decided she wants definitive surgery with  hysterectomy. If endometriosis visible, wants bilateral oophorectomy. If no endometriosis on laparoscopy, would recommend ovarian preservation.  03/12/23 has decided she definitely wants oophorectomy regardless of intra-op findings. Does not want to risk needing additional surgery. Discussed potential long term health effects including risk of heart disease, osteoporosis, increased all cause mortality. She states understanding. Will plan to be on estrogen patch after recovery.   Last pap 08/02/20 NILM  Menstrual History:  No LMP recorded.    Past Medical History:  Diagnosis Date   Anxiety    no current meds, followed w/ Behavioral Health   Arthritis    right arm and hands   Bipolar disorder ([place])    no current meds / followed w/ Behavioral Health   Heart murmur    as a child   PTSD (post-traumatic stress disorder)    Follows w/ Behavioral Health.    Past Surgical History:  Procedure Laterality Date   Fracture     when about 78 or 37 years old, fracture of right arm repair   LEEP  2010    Family History  Problem Relation Age of Onset   CAD Mother    Bipolar disorder Mother    Schizophrenia Mother    Alcohol abuse Mother    Drug abuse Father    Drug abuse Brother    Cancer Other    Hypertension Other    Diabetes Other     Social History:  reports that she has been smoking cigarettes. She has a 7.50 pack-year smoking history. She has never used smokeless tobacco. She reports current alcohol use. She reports current drug use. Drugs: Marijuana and Amphetamines.  Allergies:  Allergies  Allergen Reactions   Tape Itching and Rash    Adhesives cause burning, itching and  rash, can usually tolerate tegaderm    No medications prior to admission.    Review of Systems  Constitutional:  Negative for fever.  HENT:  Negative for sore throat.   Eyes:  Negative for visual disturbance.  Respiratory:  Negative for shortness of breath.   Cardiovascular:  Negative for chest pain.   Gastrointestinal:  Negative for abdominal pain.  Genitourinary:  Positive for pelvic pain.  Musculoskeletal:  Negative for neck stiffness.  Skin:  Negative for rash.  Neurological:  Negative for headaches.  Psychiatric/Behavioral:  Negative for suicidal ideas.     Height 5\' 3"  (1.6 m), weight 59 kg, not currently breastfeeding. Physical Exam  Chaperone Chaperone: present  Constitutional *General Appearance: healthy-appearing, well-nourished, well-developed  Head Head: normocephalic  Neck *Thyroid: no enlargement, no nodules, non-tender  Lymph Nodes *Palpation: non-tender supraclavicular nodes, non-tender axillary nodes, non-tender inguinal nodes  Cardiovascular *Auscultation: RRR, no murmur  Lungs *Respiratory Effort: no accessory muscle usage, no intercostal retractions *Auscultation: clear to auscultation, no wheezing, no rales/crackles, no rhonchi  *Breast Bilateral: no skin changes, nipple appearance: normal, no abnormal nipple secretions, no tenderness, no masses palpable  Abdomen *Inspection/Palpation/Auscultation: non-distended, no tenderness, no rebound, no guarding, soft  Female Genitalia Vulva: no masses, no atrophy, no lesions Mons: normal Labia Majora: normal, no erythema, no excoriation, no atrophy, no discoloration, no lesions Labia Minora: normal, no erythema, no excoriation, no atrophy, no discoloration, no lesions Introitus: normal *Vagina: normal, no discharge, no blood present, no erythema, no atrophy, no lesions, no ulcers, no masses, no tenderness *Cervix: grossly normal, no lesions, no discharge, no bleeding, no cervical motion tenderness *Uterus: normal size, normal contour, midline, mobile, non-tender, anteverted *Urethral Meatus/ Urethra: normal meatus *Adnexa/Parametria: no mass palpable, no tenderness  Extremities Legs: no calf tenderness  Neurological System Impressions: motor: no deficits, sensory: no  deficits  Psychiatric *Orientation: to person, to place, to time *Mood and Affect: active and alert, normal mood, normal affect  No results found for this or any previous visit (from the past 24 hour(s)).  No results found.  Assessment/Plan: 36Y P2 with chronic pelvic pain, suspected endometriosis Plan definitive management with robot assisted total laparoscopic hysterectomy, bilateral salpingo-oophorectomy, cystoscopy.  Consent reviewed in detail. Reviewed risk of infection, bleeding, blood transfusion, failure to achieve desired result, laparotomy. All questions answered. Planning extended recovery.  Ancef 2g   [name] 03/19/2023, 4:46 PM

## 2023-03-20 ENCOUNTER — Ambulatory Visit (HOSPITAL_BASED_OUTPATIENT_CLINIC_OR_DEPARTMENT_OTHER)
Admission: RE | Admit: 2023-03-20 | Discharge: 2023-03-20 | Disposition: A | Discharge status: Home / Self Care | Payer: Medicaid Other | Attending: Obstetrics and Gynecology | Admitting: Obstetrics and Gynecology

## 2023-03-20 ENCOUNTER — Ambulatory Visit (HOSPITAL_BASED_OUTPATIENT_CLINIC_OR_DEPARTMENT_OTHER): Payer: Medicaid Other | Admitting: Anesthesiology

## 2023-03-20 ENCOUNTER — Encounter (HOSPITAL_BASED_OUTPATIENT_CLINIC_OR_DEPARTMENT_OTHER): Payer: Self-pay | Admitting: Obstetrics and Gynecology

## 2023-03-20 ENCOUNTER — Other Ambulatory Visit: Payer: Self-pay

## 2023-03-20 ENCOUNTER — Encounter (HOSPITAL_BASED_OUTPATIENT_CLINIC_OR_DEPARTMENT_OTHER)
Admission: RE | Disposition: A | Discharge status: Home / Self Care | Payer: Self-pay | Source: Home / Self Care | Attending: Obstetrics and Gynecology

## 2023-03-20 DIAGNOSIS — F17210 Nicotine dependence, cigarettes, uncomplicated: Secondary | ICD-10-CM | POA: Diagnosis not present

## 2023-03-20 DIAGNOSIS — F1290 Cannabis use, unspecified, uncomplicated: Secondary | ICD-10-CM | POA: Diagnosis not present

## 2023-03-20 DIAGNOSIS — R102 Pelvic and perineal pain: Secondary | ICD-10-CM | POA: Insufficient documentation

## 2023-03-20 DIAGNOSIS — N858 Other specified noninflammatory disorders of uterus: Secondary | ICD-10-CM | POA: Diagnosis not present

## 2023-03-20 DIAGNOSIS — F419 Anxiety disorder, unspecified: Secondary | ICD-10-CM | POA: Insufficient documentation

## 2023-03-20 DIAGNOSIS — F4310 Post-traumatic stress disorder, unspecified: Secondary | ICD-10-CM | POA: Diagnosis not present

## 2023-03-20 DIAGNOSIS — F319 Bipolar disorder, unspecified: Secondary | ICD-10-CM | POA: Diagnosis not present

## 2023-03-20 DIAGNOSIS — G8929 Other chronic pain: Secondary | ICD-10-CM | POA: Diagnosis not present

## 2023-03-20 DIAGNOSIS — Z90710 Acquired absence of both cervix and uterus: Secondary | ICD-10-CM | POA: Diagnosis present

## 2023-03-20 DIAGNOSIS — Z01818 Encounter for other preprocedural examination: Secondary | ICD-10-CM

## 2023-03-20 DIAGNOSIS — N888 Other specified noninflammatory disorders of cervix uteri: Secondary | ICD-10-CM | POA: Diagnosis not present

## 2023-03-20 DIAGNOSIS — N72 Inflammatory disease of cervix uteri: Secondary | ICD-10-CM | POA: Insufficient documentation

## 2023-03-20 HISTORY — DX: Unspecified osteoarthritis, unspecified site: M19.90

## 2023-03-20 HISTORY — PX: CYSTOSCOPY: SHX5120

## 2023-03-20 HISTORY — DX: Post-traumatic stress disorder, unspecified: F43.10

## 2023-03-20 HISTORY — PX: ROBOTIC ASSISTED TOTAL HYSTERECTOMY WITH BILATERAL SALPINGO OOPHERECTOMY: SHX6086

## 2023-03-20 HISTORY — DX: Cardiac murmur, unspecified: R01.1

## 2023-03-20 LAB — POCT PREGNANCY, URINE: Preg Test, Ur: NEGATIVE

## 2023-03-20 LAB — TYPE AND SCREEN
ABO/RH(D): O POS
Antibody Screen: NEGATIVE

## 2023-03-20 SURGERY — HYSTERECTOMY, TOTAL, ROBOT-ASSISTED, LAPAROSCOPIC, WITH BILATERAL SALPINGO-OOPHORECTOMY
Anesthesia: General | Site: Bladder

## 2023-03-20 MED ORDER — HYDROMORPHONE HCL 1 MG/ML IJ SOLN
0.2000 mg | INTRAMUSCULAR | Status: DC | PRN
  Administered 2023-03-20: 0.5 mg via INTRAVENOUS

## 2023-03-20 MED ORDER — SIMETHICONE 80 MG PO CHEW
80.0000 mg | CHEWABLE_TABLET | Freq: Four times a day (QID) | ORAL | Status: DC | PRN
  Administered 2023-03-20: 80 mg via ORAL

## 2023-03-20 MED ORDER — DOCUSATE SODIUM 100 MG PO CAPS
ORAL_CAPSULE | ORAL | Status: AC
  Filled 2023-03-20: qty 1

## 2023-03-20 MED ORDER — FLUORESCEIN SODIUM 10 % IV SOLN
INTRAVENOUS | Status: DC | PRN
  Administered 2023-03-20: 1 mL via INTRAVENOUS

## 2023-03-20 MED ORDER — OXYCODONE HCL 5 MG PO TABS
ORAL_TABLET | ORAL | Status: AC
  Filled 2023-03-20: qty 1

## 2023-03-20 MED ORDER — MIDAZOLAM HCL 2 MG/2ML IJ SOLN
INTRAMUSCULAR | Status: AC
  Filled 2023-03-20: qty 2

## 2023-03-20 MED ORDER — DOCUSATE SODIUM 100 MG PO CAPS: 100.0000 mg | ORAL_CAPSULE | Freq: Two times a day (BID) | ORAL | 0 refills | Status: DC | PRN

## 2023-03-20 MED ORDER — ONDANSETRON HCL 4 MG/2ML IJ SOLN: 4.0000 mg | Freq: Four times a day (QID) | INTRAMUSCULAR | Status: DC | PRN

## 2023-03-20 MED ORDER — CEFAZOLIN SODIUM-DEXTROSE 2-4 GM/100ML-% IV SOLN
INTRAVENOUS | Status: AC
  Filled 2023-03-20: qty 100

## 2023-03-20 MED ORDER — SUGAMMADEX SODIUM 200 MG/2ML IV SOLN
INTRAVENOUS | Status: DC | PRN
  Administered 2023-03-20: 140 mg via INTRAVENOUS

## 2023-03-20 MED ORDER — ROCURONIUM BROMIDE 10 MG/ML (PF) SYRINGE
PREFILLED_SYRINGE | INTRAVENOUS | Status: AC
  Filled 2023-03-20: qty 40

## 2023-03-20 MED ORDER — KETAMINE HCL 50 MG/5ML IJ SOSY
PREFILLED_SYRINGE | INTRAMUSCULAR | Status: AC
  Filled 2023-03-20: qty 5

## 2023-03-20 MED ORDER — KETOROLAC TROMETHAMINE 30 MG/ML IJ SOLN
INTRAMUSCULAR | Status: AC
  Filled 2023-03-20: qty 1

## 2023-03-20 MED ORDER — ONDANSETRON HCL 4 MG PO TABS: 4.0000 mg | ORAL_TABLET | Freq: Four times a day (QID) | ORAL | Status: DC | PRN

## 2023-03-20 MED ORDER — DIPHENHYDRAMINE HCL 50 MG/ML IJ SOLN
INTRAMUSCULAR | Status: AC
  Filled 2023-03-20: qty 1

## 2023-03-20 MED ORDER — FENTANYL CITRATE (PF) 250 MCG/5ML IJ SOLN
INTRAMUSCULAR | Status: DC | PRN
  Administered 2023-03-20: 25 ug via INTRAVENOUS
  Administered 2023-03-20 (×4): 50 ug via INTRAVENOUS

## 2023-03-20 MED ORDER — FLUORESCEIN SODIUM 10 % IV SOLN
INTRAVENOUS | Status: AC
  Filled 2023-03-20: qty 5

## 2023-03-20 MED ORDER — PROPOFOL 10 MG/ML IV BOLUS
INTRAVENOUS | Status: AC
  Filled 2023-03-20: qty 20

## 2023-03-20 MED ORDER — OXYCODONE HCL 5 MG PO TABS
5.0000 mg | ORAL_TABLET | ORAL | Status: DC | PRN
  Administered 2023-03-20: 5 mg via ORAL

## 2023-03-20 MED ORDER — ACETAMINOPHEN 500 MG PO TABS
ORAL_TABLET | ORAL | Status: AC
  Filled 2023-03-20: qty 2

## 2023-03-20 MED ORDER — ACETAMINOPHEN 500 MG PO TABS
1000.0000 mg | ORAL_TABLET | ORAL | Status: AC
  Administered 2023-03-20: 1000 mg via ORAL

## 2023-03-20 MED ORDER — PROPOFOL 10 MG/ML IV BOLUS
INTRAVENOUS | Status: DC | PRN
  Administered 2023-03-20: 20 mg via INTRAVENOUS
  Administered 2023-03-20: 180 mg via INTRAVENOUS

## 2023-03-20 MED ORDER — ACETAMINOPHEN 500 MG PO TABS
1000.0000 mg | ORAL_TABLET | Freq: Four times a day (QID) | ORAL | Status: DC
  Administered 2023-03-20: 1000 mg via ORAL

## 2023-03-20 MED ORDER — LACTATED RINGERS IV SOLN: INTRAVENOUS | Status: DC

## 2023-03-20 MED ORDER — SIMETHICONE 80 MG PO CHEW
CHEWABLE_TABLET | ORAL | Status: AC
  Filled 2023-03-20: qty 1

## 2023-03-20 MED ORDER — 0.9 % SODIUM CHLORIDE (POUR BTL) OPTIME
TOPICAL | Status: DC | PRN
  Administered 2023-03-20: 500 mL

## 2023-03-20 MED ORDER — SODIUM CHLORIDE 0.9 % IR SOLN
Status: DC | PRN
  Administered 2023-03-20 (×2): 1000 mL

## 2023-03-20 MED ORDER — PROMETHAZINE HCL 25 MG/ML IJ SOLN: 6.2500 mg | INTRAMUSCULAR | Status: DC | PRN

## 2023-03-20 MED ORDER — DIPHENHYDRAMINE HCL 50 MG/ML IJ SOLN
INTRAMUSCULAR | Status: DC | PRN
  Administered 2023-03-20: 12.5 mg via INTRAVENOUS

## 2023-03-20 MED ORDER — DOCUSATE SODIUM 100 MG PO CAPS
100.0000 mg | ORAL_CAPSULE | Freq: Two times a day (BID) | ORAL | Status: DC
  Administered 2023-03-20: 100 mg via ORAL

## 2023-03-20 MED ORDER — LIDOCAINE 2% (20 MG/ML) 5 ML SYRINGE
INTRAMUSCULAR | Status: DC | PRN
  Administered 2023-03-20: 50 mg via INTRAVENOUS

## 2023-03-20 MED ORDER — NICOTINE 7 MG/24HR TD PT24: 7.0000 mg | MEDICATED_PATCH | Freq: Every day | TRANSDERMAL | Status: DC

## 2023-03-20 MED ORDER — ONDANSETRON HCL 4 MG/2ML IJ SOLN
INTRAMUSCULAR | Status: DC | PRN
  Administered 2023-03-20: 4 mg via INTRAVENOUS

## 2023-03-20 MED ORDER — MENTHOL 3 MG MT LOZG: 1.0000 | LOZENGE | OROMUCOSAL | Status: DC | PRN

## 2023-03-20 MED ORDER — ROCURONIUM BROMIDE 10 MG/ML (PF) SYRINGE
PREFILLED_SYRINGE | INTRAVENOUS | Status: DC | PRN
  Administered 2023-03-20: 20 mg via INTRAVENOUS
  Administered 2023-03-20: 50 mg via INTRAVENOUS

## 2023-03-20 MED ORDER — POLYETHYLENE GLYCOL 3350 17 G PO PACK: 17.0000 g | PACK | Freq: Every day | ORAL | Status: DC | PRN

## 2023-03-20 MED ORDER — HYDROMORPHONE HCL 1 MG/ML IJ SOLN
INTRAMUSCULAR | Status: AC
  Filled 2023-03-20: qty 1

## 2023-03-20 MED ORDER — KETAMINE HCL 10 MG/ML IJ SOLN
INTRAMUSCULAR | Status: DC | PRN
  Administered 2023-03-20: 10 mg via INTRAVENOUS
  Administered 2023-03-20: 20 mg via INTRAVENOUS

## 2023-03-20 MED ORDER — ACETAMINOPHEN 325 MG PO TABS: 650.0000 mg | ORAL_TABLET | Freq: Four times a day (QID) | ORAL | Status: DC | PRN

## 2023-03-20 MED ORDER — SODIUM CHLORIDE 0.9 % IV SOLN
INTRAVENOUS | Status: DC | PRN
  Administered 2023-03-20: 60 mL

## 2023-03-20 MED ORDER — CELECOXIB 200 MG PO CAPS
ORAL_CAPSULE | ORAL | Status: AC
  Filled 2023-03-20: qty 1

## 2023-03-20 MED ORDER — FENTANYL CITRATE (PF) 250 MCG/5ML IJ SOLN
INTRAMUSCULAR | Status: AC
  Filled 2023-03-20: qty 5

## 2023-03-20 MED ORDER — CEFAZOLIN SODIUM-DEXTROSE 2-4 GM/100ML-% IV SOLN
2.0000 g | INTRAVENOUS | Status: AC
  Administered 2023-03-20: 2 g via INTRAVENOUS

## 2023-03-20 MED ORDER — ALUM & MAG HYDROXIDE-SIMETH 200-200-20 MG/5ML PO SUSP: 30.0000 mL | ORAL | Status: DC | PRN

## 2023-03-20 MED ORDER — LIDOCAINE HCL (PF) 2 % IJ SOLN
INTRAMUSCULAR | Status: AC
  Filled 2023-03-20: qty 25

## 2023-03-20 MED ORDER — HEMOSTATIC AGENTS (NO CHARGE) OPTIME
TOPICAL | Status: DC | PRN
  Administered 2023-03-20: 1

## 2023-03-20 MED ORDER — DEXAMETHASONE SODIUM PHOSPHATE 10 MG/ML IJ SOLN
INTRAMUSCULAR | Status: DC | PRN
  Administered 2023-03-20: 10 mg via INTRAVENOUS

## 2023-03-20 MED ORDER — MIDAZOLAM HCL 2 MG/2ML IJ SOLN
INTRAMUSCULAR | Status: DC | PRN
  Administered 2023-03-20: 2 mg via INTRAVENOUS

## 2023-03-20 MED ORDER — OXYCODONE HCL 5 MG PO TABS: 5.0000 mg | ORAL_TABLET | Freq: Once | ORAL | Status: DC | PRN

## 2023-03-20 MED ORDER — OXYCODONE HCL 5 MG/5ML PO SOLN: 5.0000 mg | Freq: Once | ORAL | Status: DC | PRN

## 2023-03-20 MED ORDER — FENTANYL CITRATE (PF) 100 MCG/2ML IJ SOLN: 25.0000 ug | INTRAMUSCULAR | Status: DC | PRN

## 2023-03-20 MED ORDER — ROCURONIUM BROMIDE 10 MG/ML (PF) SYRINGE
PREFILLED_SYRINGE | INTRAVENOUS | Status: AC
  Filled 2023-03-20: qty 10

## 2023-03-20 MED ORDER — CELECOXIB 200 MG PO CAPS
200.0000 mg | ORAL_CAPSULE | Freq: Once | ORAL | Status: AC
  Administered 2023-03-20: 200 mg via ORAL

## 2023-03-20 MED ORDER — IBUPROFEN 200 MG PO TABS: 600.0000 mg | ORAL_TABLET | Freq: Four times a day (QID) | ORAL | Status: DC

## 2023-03-20 MED ORDER — KETOROLAC TROMETHAMINE 30 MG/ML IJ SOLN
30.0000 mg | Freq: Four times a day (QID) | INTRAMUSCULAR | Status: DC
  Administered 2023-03-20: 30 mg via INTRAVENOUS

## 2023-03-20 SURGICAL SUPPLY — 60 items
ADH SKN CLS APL DERMABOND .7 (GAUZE/BANDAGES/DRESSINGS) ×2
APL ESCP 73.6OZ SRGCL (TIP) ×2
COVER BACK TABLE 60X90IN (DRAPES) ×2 IMPLANT
COVER SURGICAL LIGHT HANDLE (MISCELLANEOUS) IMPLANT
COVER TIP SHEARS 8 DVNC (MISCELLANEOUS) ×2 IMPLANT
COVER TIP SHEARS 8MM DA VINCI (MISCELLANEOUS) ×2
DEFOGGER SCOPE WARMER CLEARIFY (MISCELLANEOUS) ×2 IMPLANT
DERMABOND ADVANCED .7 DNX12 (GAUZE/BANDAGES/DRESSINGS) ×2 IMPLANT
DRAPE ARM DVNC X/XI (DISPOSABLE) ×8 IMPLANT
DRAPE COLUMN DVNC XI (DISPOSABLE) ×2 IMPLANT
DRAPE DA VINCI XI ARM (DISPOSABLE) ×8
DRAPE DA VINCI XI COLUMN (DISPOSABLE) ×2
DRAPE SURG IRRIG POUCH 19X23 (DRAPES) ×2 IMPLANT
DRAPE UTILITY XL STRL (DRAPES) ×2 IMPLANT
DURAPREP 26ML APPLICATOR (WOUND CARE) ×2 IMPLANT
ELECT REM PT RETURN 9FT ADLT (ELECTROSURGICAL) ×2
ELECTRODE REM PT RTRN 9FT ADLT (ELECTROSURGICAL) ×2 IMPLANT
GAUZE 4X4 16PLY ~~LOC~~+RFID DBL (SPONGE) IMPLANT
GLOVE BIO SURGEON STRL SZ 6 (GLOVE) ×4 IMPLANT
GLOVE BIO SURGEON STRL SZ7 (GLOVE) IMPLANT
GLOVE BIOGEL PI IND STRL 6 (GLOVE) IMPLANT
GLOVE BIOGEL PI IND STRL 6.5 (GLOVE) ×6 IMPLANT
GLOVE BIOGEL PI IND STRL 7.0 (GLOVE) IMPLANT
GLOVE ECLIPSE 6.5 STRL STRAW (GLOVE) IMPLANT
HOLDER FOLEY CATH W/STRAP (MISCELLANEOUS) IMPLANT
IRRIG SUCT STRYKERFLOW 2 WTIP (MISCELLANEOUS) ×2
IRRIGATION SUCT STRKRFLW 2 WTP (MISCELLANEOUS) ×2 IMPLANT
IV NS 1000ML (IV SOLUTION) ×4
IV NS 1000ML BAXH (IV SOLUTION) IMPLANT
KIT PINK PAD W/HEAD ARE REST (MISCELLANEOUS) ×2
KIT PINK PAD W/HEAD ARM REST (MISCELLANEOUS) ×2 IMPLANT
KIT TURNOVER CYSTO (KITS) ×2 IMPLANT
LEGGING LITHOTOMY PAIR STRL (DRAPES) ×2 IMPLANT
MANIPULATOR ADVINCU DEL 3.0 PL (MISCELLANEOUS) IMPLANT
NDL INSUFFLATION 14GA 120MM (NEEDLE) ×2 IMPLANT
NEEDLE INSUFFLATION 14GA 120MM (NEEDLE) ×2 IMPLANT
NS IRRIG 500ML POUR BTL (IV SOLUTION) IMPLANT
OBTURATOR OPTICAL STANDARD 8MM (TROCAR) ×2
OBTURATOR OPTICAL STND 8 DVNC (TROCAR) ×2
OBTURATOR OPTICALSTD 8 DVNC (TROCAR) ×2 IMPLANT
PACK ROBOT WH (CUSTOM PROCEDURE TRAY) ×2 IMPLANT
PACK ROBOTIC GOWN (GOWN DISPOSABLE) ×2 IMPLANT
PAD OB MATERNITY 4.3X12.25 (PERSONAL CARE ITEMS) ×2 IMPLANT
PAD PREP 24X48 CUFFED NSTRL (MISCELLANEOUS) ×2 IMPLANT
POUCH LAPAROSCOPIC INSTRUMENT (MISCELLANEOUS) IMPLANT
POWDER SURGICEL 3.0 GRAM (HEMOSTASIS) IMPLANT
SEAL UNIV 5-12 XI (MISCELLANEOUS) ×6 IMPLANT
SEAL XI UNIVERSAL 5-12 (MISCELLANEOUS) ×8
SET IRRIG Y TYPE TUR BLADDER L (SET/KITS/TRAYS/PACK) ×2 IMPLANT
SET TRI-LUMEN FLTR TB AIRSEAL (TUBING) ×2 IMPLANT
SLEEVE SCD COMPRESS KNEE MED (STOCKING) ×2 IMPLANT
SPIKE FLUID TRANSFER (MISCELLANEOUS) ×2 IMPLANT
SUT VIC AB 0 CT1 36 (SUTURE) ×2 IMPLANT
SUT VICRYL RAPIDE 3 0 (SUTURE) ×4 IMPLANT
SUT VLOC 180 0 9IN  GS21 (SUTURE) ×2
SUT VLOC 180 0 9IN GS21 (SUTURE) ×2 IMPLANT
TIP ENDOSCOPIC SURGICEL (TIP) IMPLANT
TOWEL OR 17X24 6PK STRL BLUE (TOWEL DISPOSABLE) ×2 IMPLANT
TRAY FOLEY W/BAG SLVR 14FR LF (SET/KITS/TRAYS/PACK) ×2 IMPLANT
TROCAR PORT AIRSEAL 5X120 (TROCAR) ×2 IMPLANT

## 2023-03-20 NOTE — Anesthesia Postprocedure Evaluation (Signed)
Anesthesia Post Note  Patient: Veronica [name]  Procedure(s) Performed: XI ROBOTIC ASSISTED TOTAL HYSTERECTOMY WITH BILATERAL SALPINGO OOPHORECTOMY (Abdomen) CYSTOSCOPY (Bladder)     Patient location during evaluation: PACU Anesthesia Type: General Level of consciousness: awake and alert Pain management: pain level controlled Vital Signs Assessment: post-procedure vital signs reviewed and stable Respiratory status: spontaneous breathing, nonlabored ventilation and respiratory function stable Cardiovascular status: stable and blood pressure returned to baseline Anesthetic complications: no   No notable events documented.  Last Vitals:  Vitals:   03/20/23 1014 03/20/23 1045  BP: 127/81 127/77  Pulse: 63 65  Resp:  16  Temp:    SpO2: 100% 99%    Last Pain:  Vitals:   03/20/23 1045  TempSrc:   PainSc: [place]

## 2023-03-20 NOTE — Op Note (Signed)
03/20/2023   9:10 AM   PATIENT:  Veronica [name]  37 y.o. female   PRE-OPERATIVE DIAGNOSIS:  chronic pelvic pain, suspected endometriosis, desire for oophorectomy   POST-OPERATIVE DIAGNOSIS:  chronic pelvic pain, suspected endometriosis, desire for oophorectomy   PROCEDURE:  Procedure(s): XI ROBOTIC ASSISTED TOTAL HYSTERECTOMY WITH BILATERAL SALPINGO OOPHORECTOMY (N/A) CYSTOSCOPY (N/A)   SURGEON:  Surgeon(s) and Role:    * [name], MD - Primary    * Bobbye Charleston, MD - Assisting     ANESTHESIA:   local and general   EBL:  20 mL    BLOOD ADMINISTERED:none   DRAINS: Urinary Catheter (Foley)    LOCAL MEDICATIONS USED:  OTHER Ropivacaine   SPECIMEN:  Source of Specimen:  uterus, cervix, bilateral fallopian tubes, bilateral ovaries   DISPOSITION OF SPECIMEN:  PATHOLOGY   COUNTS:  YES    PLAN OF CARE: Admit for overnight observation   PATIENT DISPOSITION:  PACU - hemodynamically stable.  COMPLICATIONS: none  FINDINGS:  On bimanual exam, globular uterus sounded to 8cm.  On laparoscopic view, globular appearing uterus with 3 whitish implants on serosal surface of uterus (right fundal and left anterior). Fallopian tubes and ovaries bilaterally. Normal appearing ureters bilaterally. Grossly normal appearing appendix and upper abdomen.  Cystoscopy demonstrated intact bladder and bilateral ureteral jets.    DESCRIPTION OF PROCEDURE: After consent was verified the patient was taken to the operating room.  After adequate anesthesia was achieved the patient was positioned in the dorsal lithotomy position with legs in stirrups. SCDs were in place and cycling.  Exam revealed a 8 week size globular uterus. The patient was prepped and draped in normal sterile fashion. Ancef 2g was given for infection prophylaxis. A speculum was placed in the vagina and the anterior lip of the cervix was grasped with a tenaculum. A stay suture was placed on the anterior lip of the  cervix. The uterus was sounded to 8cm and the cervix dilated with [facility] dilators.  The 3.0 cm delineating ring was assembled and placed in the proper fashion.  The speculum was removed and the bladder was drained with a Foley catheter.   Attention was turned to the abdomen. An 84mm incision was made 1 cm above the umbilicus and the Veress needle was inserted through the incision. Correct placement was confirmed by opening pressure of 21mmHg. The abdomen was insufflated with CO2 gas to a total pressure of 64mmHg. The 8 mm robotic trochar was inserted into the abdominal cavity. The laparoscope was inserted and confirmed no vascular or visceral trauma from entry. The patient was placed in steep Trendelenburg.  Two additional 14mm robotic trochars were placed, one on each side of the abdomen. An 26mm Airseal assistant port was placed on the left side. All trochars were inserted under direct visualization of the camera.  The robot was docked.  The fenestrated bipolar was placed on arm 1 and the monopolar scissors on arm 3 and introduced under direct visualization of the camera.   Survey of the abdomen revealed findings as noted above. Bilateral  ureters were visualized peristalsing. The right round ligament was ligated and peritoneum opened parallel and laterally to the infundibulopelvic ligament.   A window was made superior to the visualized ureter to isolate the infundibulopelvic ligament. The right infundibulopelvic ligament was ligated.  The vesicouterine peritoneum was opened and the bladder was dissected off the lower uterine segment and upper vagina.  The same was repeated on the left side. The right  posterior broad ligament was skeletonized. The same process was repeated on the left side. The right uterine vessels were dessicated with bipolar cautery and transected with monopolar scissors. The left uterine vessels were transected in the same fashion. The cervicovaginal junction was identified with the  delineating ring and the monopolar scissors were used to enter the vagina and incise circumferentially around the cervicovaginal junction. The uterus, cervix, bilateral fallopian tubes, and bilateral ovaries were removed through the vagina and sent for pathology. The vaginal cuff was closed with V loc suture in a running fashion. The suture was removed. An assistant palpated the vaginal cuff from the vaginal field and confirmed the vaginal cuff was intact without defect. The abdomen was irrigated and suctioned. Surgicel powder was applied to the peritoneum dissection sites. The pelvis was observed to be hemostatic at 67mmHg and 48mmHg. The robot was undocked and Ropivicaine was introduced into the pelvis. All ports were removed. Fluorescein was administered.    The port sites were closed with 3-0 vicryl in a subcuticular fashion and covered with skin glue.  The foley catheter was removed. A cystoscope was inserted into the bladder and the bladder was distended with saline. Survey revealed intact bladder and bilateral ureteral jets were appreciated. The bladder was drained and foley reintroduced. A speculum was inserted into the vagina and vaginal cuff appeared intact and hemostatic. A manual exam again confirmed the vaginal cuff was intact without defect.    The patient was awakened from anesthesia and taken to the recovery room in stable condition.

## 2023-03-20 NOTE — Brief Op Note (Signed)
03/20/2023  9:10 AM  PATIENT:  Veronica [name]  37 y.o. female  PRE-OPERATIVE DIAGNOSIS:  chronic pelvic pain, suspected endometriosis, desire for oophorectomy  POST-OPERATIVE DIAGNOSIS:  chronic pelvic pain, suspected endometriosis, desire for oophorectomy  PROCEDURE:  Procedure(s): XI ROBOTIC ASSISTED TOTAL HYSTERECTOMY WITH BILATERAL SALPINGO OOPHORECTOMY (N/A) CYSTOSCOPY (N/A)  SURGEON:  Surgeon(s) and Role:    * [name], MD - Primary    * Bobbye Charleston, MD - Assisting   ANESTHESIA:   local and general  EBL:  20 mL   BLOOD ADMINISTERED:none  DRAINS: Urinary Catheter (Foley)   LOCAL MEDICATIONS USED:  OTHER Ropivacaine  SPECIMEN:  Source of Specimen:  uterus, cervix, bilateral fallopian tubes, bilateral ovaries  DISPOSITION OF SPECIMEN:  PATHOLOGY  COUNTS:  YES  TOURNIQUET:  * No tourniquets in log *  DICTATION: .Note written in EPIC  PLAN OF CARE: Admit for overnight observation  PATIENT DISPOSITION:  PACU - hemodynamically stable.   Delay start of Pharmacological VTE agent (>24hrs) due to surgical blood loss or risk of bleeding: not applicable

## 2023-03-20 NOTE — Anesthesia Procedure Notes (Signed)
Procedure Name: Intubation Date/Time: 03/20/2023 7:33 AM  Performed by: Clearnce Sorrel, CRNAPre-anesthesia Checklist: Patient identified, Emergency Drugs available, Suction available and Patient being monitored Patient Re-evaluated:Patient Re-evaluated prior to induction Oxygen Delivery Method: Circle System Utilized Preoxygenation: Pre-oxygenation with 100% oxygen Induction Type: IV induction Ventilation: Mask ventilation without difficulty Laryngoscope Size: Mac and 3 Grade View: Grade I Tube type: Oral Tube size: 7.0 mm Number of attempts: 1 Airway Equipment and Method: Stylet and Oral airway Placement Confirmation: ETT inserted through vocal cords under direct vision, positive ETCO2 and breath sounds checked- equal and bilateral Secured at: 21 cm Tube secured with: Tape Dental Injury: Teeth and Oropharynx as per pre-operative assessment

## 2023-03-20 NOTE — Interval H&P Note (Signed)
History and Physical Interval Note:  03/20/2023 7:16 AM  Veronica [name] LEARNED  has presented today for surgery, with the diagnosis of suspected endometriosis.  The various methods of treatment have been discussed with the patient and family. After consideration of risks, benefits and other options for treatment, the patient has consented to  Procedure(s): XI ROBOTIC ASSISTED TOTAL HYSTERECTOMY WITH BILATERAL SALPINGO OOPHORECTOMY (N/A) CYSTOSCOPY (N/A) as a surgical intervention.  We once again reviewed ovarian conservation vs. oophorectomy, she remains adamant that she desires oophorectomy to reduce ovarian and breast cancer risk as well as risk of need for additional surgery.  The patient's history has been reviewed, patient examined, no change in status, stable for surgery.  I have reviewed the patient's chart and labs.  Questions were answered to the patient's satisfaction.     [name]

## 2023-03-20 NOTE — Transfer of Care (Signed)
Immediate Anesthesia Transfer of Care Note  Patient: Madilyn M Pokorney  Procedure(s) Performed: XI ROBOTIC ASSISTED TOTAL HYSTERECTOMY WITH BILATERAL SALPINGO OOPHORECTOMY (Abdomen) CYSTOSCOPY (Bladder)  Patient Location: PACU  Anesthesia Type:General  Level of Consciousness: drowsy  Airway & Oxygen Therapy: Patient Spontanous Breathing and Patient connected to face mask oxygen  Post-op Assessment: Report given to RN  Post vital signs: Reviewed and stable  Last Vitals:  Vitals Value Taken Time  BP 144/75 03/20/23 0915  Temp    Pulse 98 03/20/23 0915  Resp 18 03/20/23 0915  SpO2 100 % 03/20/23 0915  Vitals shown include unvalidated device data.  Last Pain:  Vitals:   03/20/23 0555  TempSrc: Oral  PainSc: 2       Patients Stated Pain Goal: 6 ([id number])  Complications: No notable events documented.

## 2023-03-21 ENCOUNTER — Encounter (HOSPITAL_BASED_OUTPATIENT_CLINIC_OR_DEPARTMENT_OTHER): Payer: Self-pay | Admitting: Obstetrics and Gynecology

## 2023-03-21 LAB — SURGICAL PATHOLOGY

## 2023-08-05 IMAGING — US US PELVIS COMPLETE TRANSABD/TRANSVAG W DUPLEX
1 series · 13 of 25 positions shown · non-contrast
Comparison: CT abdomen pelvis January 31, 2016

CLINICAL DATA: Left adnexal pain

EXAM:
TRANSABDOMINAL AND TRANSVAGINAL ULTRASOUND OF PELVIS
DOPPLER ULTRASOUND OF OVARIES
TECHNIQUE: Both transabdominal and transvaginal ultrasound examinations of the
pelvis were performed. Transabdominal technique was performed for
global imaging of the pelvis including uterus, ovaries, adnexal
regions, and pelvic cul-de-sac.
It was necessary to proceed with endovaginal exam following the
transabdominal exam to visualize the endometrium ovaries and adnexa.
Color and duplex Doppler ultrasound was utilized to evaluate blood
flow to the ovaries.

[Series 1: us pelvis complete transabd/transvag w duplex · 115 acquisitions, 13 frames shown]
[im 1/115]
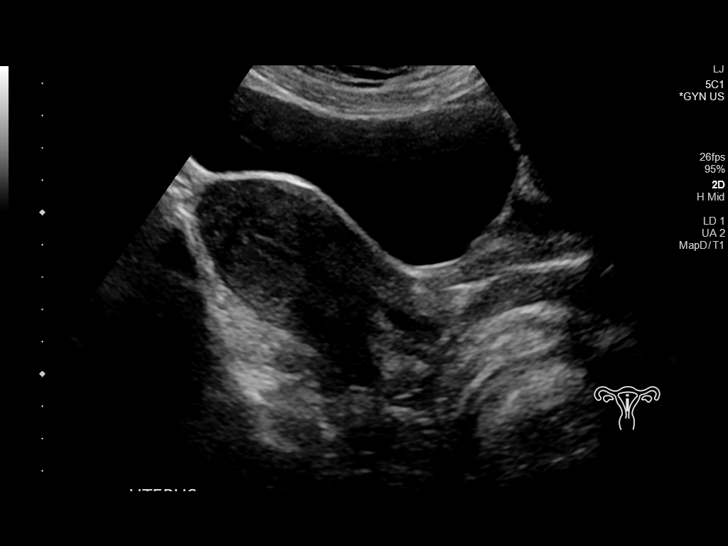
[im 10/115]
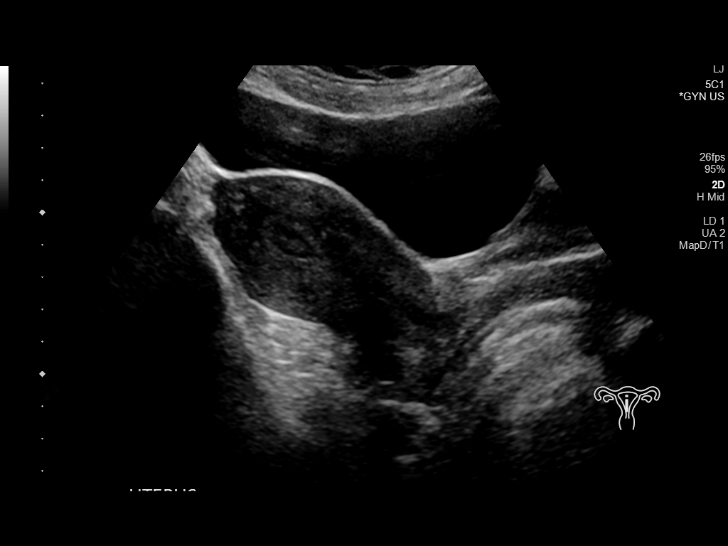
[im 20/115]
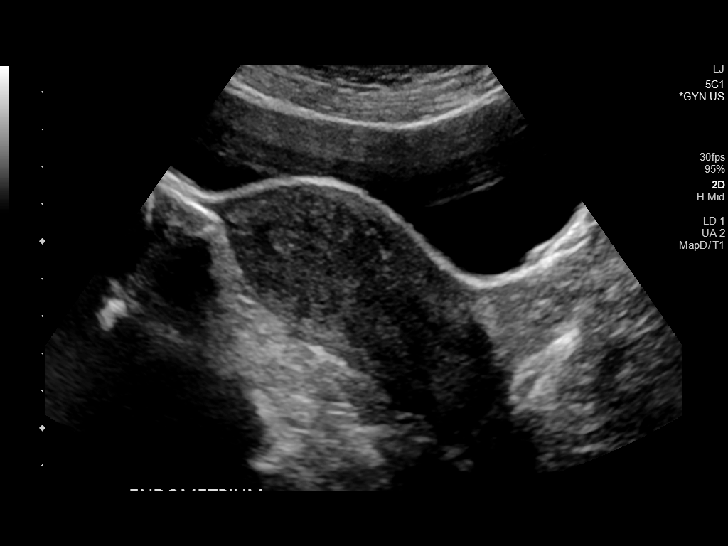
[im 29/115]
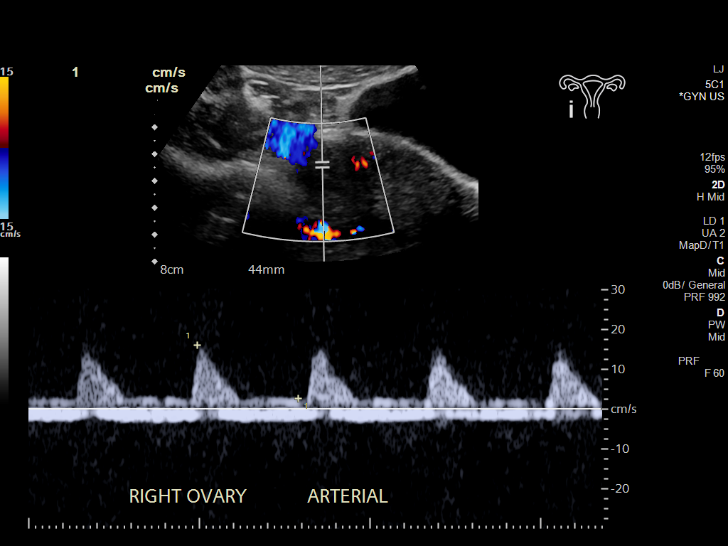
[im 39/115]
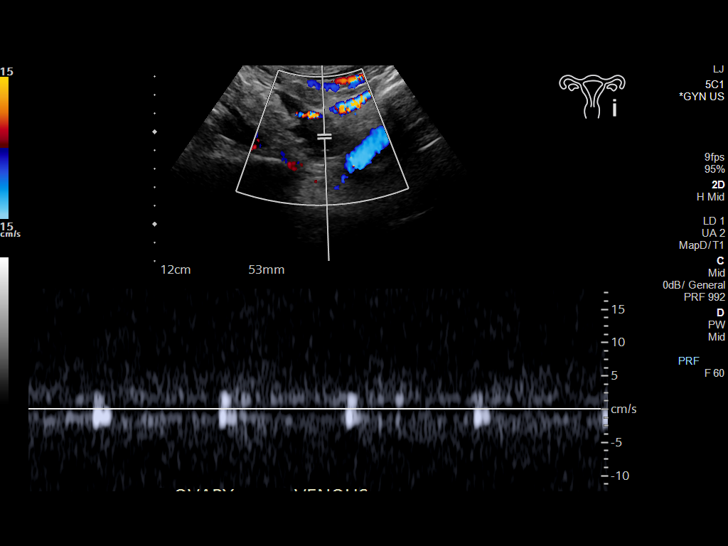
[im 48/115]
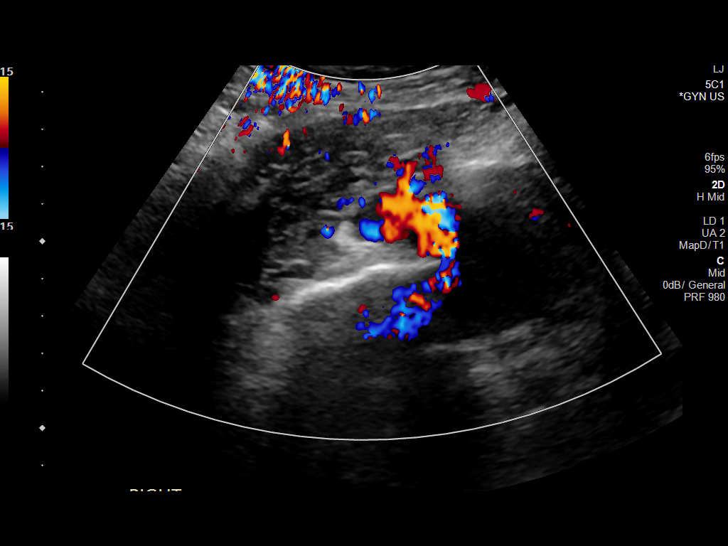
[im 58/115]
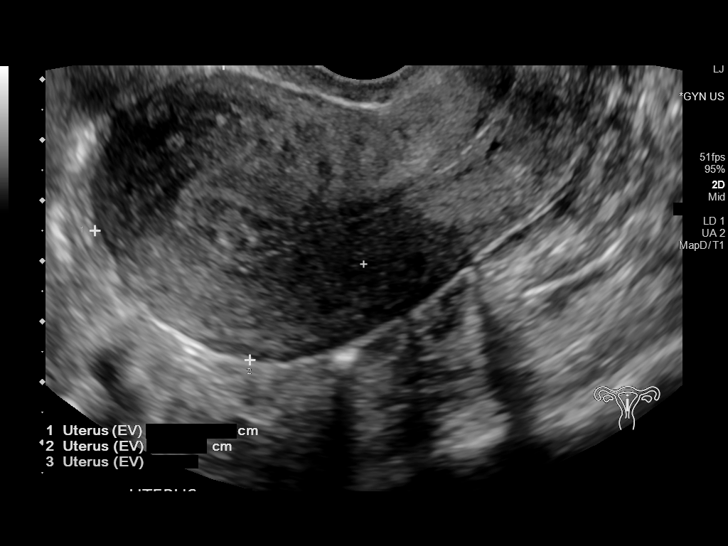
[im 67/115]
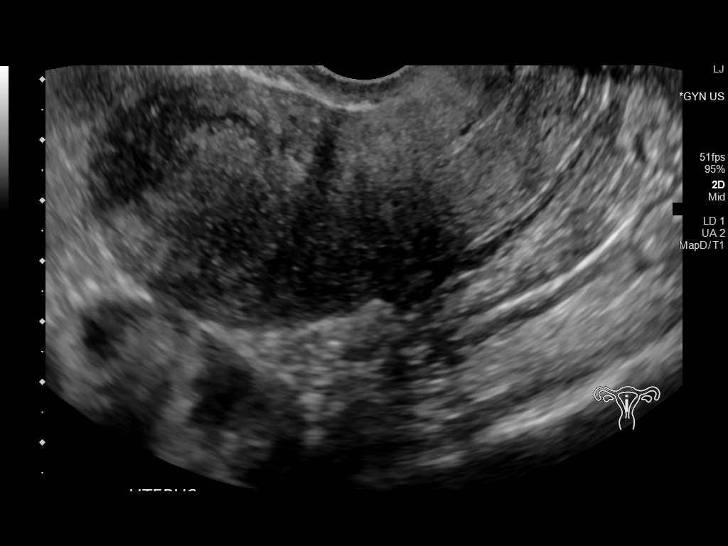
[im 77/115]
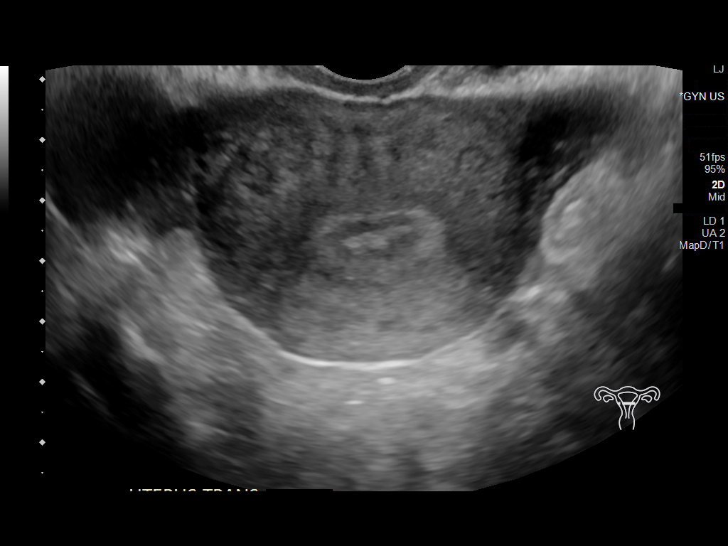
[im 86/115]
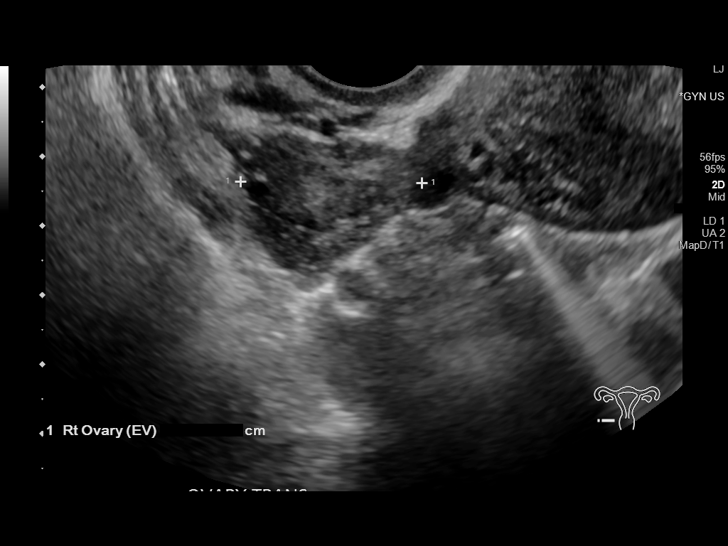
[im 96/115]
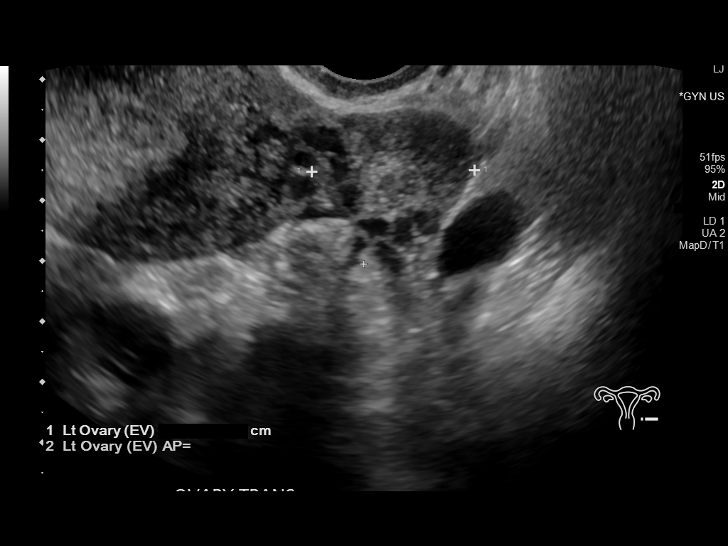
[im 105/115]
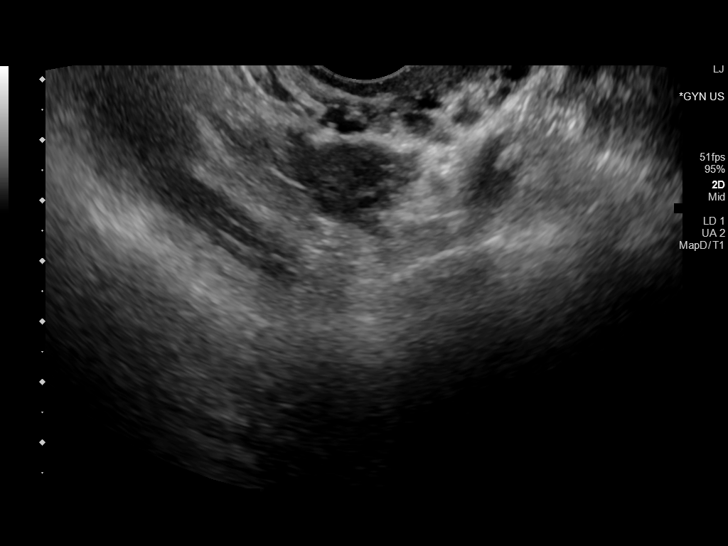
[im 115/115]
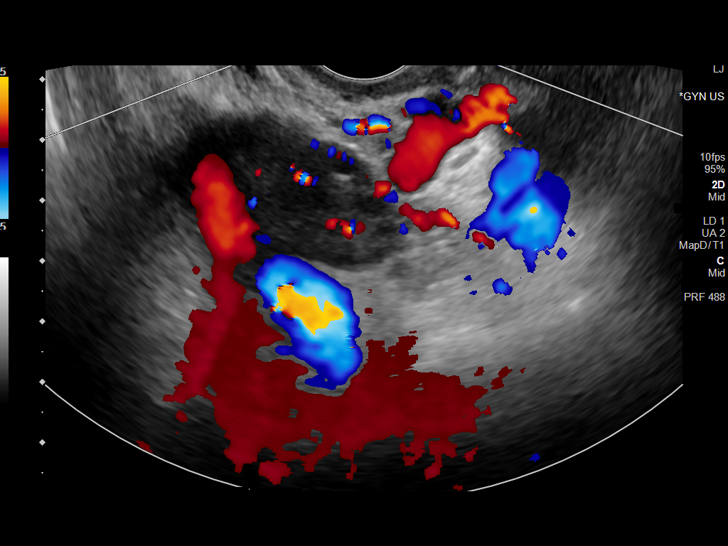

[13 of 25 positions shown; findings below may reference images not displayed]

FINDINGS: Uterus

Measurements: 9.8 x 5.2 x 3.7 cm = volume: 99 mL. No fibroids or
other mass visualized.

Endometrium

Thickness: 10 mm.  No focal abnormality visualized.

Right ovary

Measurements: 3.2 x 2.6 x 2.0 cm = volume: 9 mL. Normal
appearance/no adnexal mass.

Left ovary

Measurements: 3.8 x 2.7 x 2.3 cm = volume: 12 mL. Normal
appearance/no adnexal mass.

Pulsed Doppler evaluation of both ovaries demonstrates normal
low-resistance arterial and venous waveforms.

Other findings

No abnormal free fluid.
IMPRESSION: Normal ultrasound of the pelvis.

## 2023-08-21 ENCOUNTER — Encounter: Payer: Self-pay | Admitting: Family Medicine

## 2023-08-21 ENCOUNTER — Ambulatory Visit (INDEPENDENT_AMBULATORY_CARE_PROVIDER_SITE_OTHER): Payer: Medicaid Other

## 2023-08-21 ENCOUNTER — Ambulatory Visit: Payer: Medicaid Other | Admitting: Family Medicine

## 2023-08-21 VITALS — BP 112/74 | HR 67 | Temp 98.4°F | Ht 63.75 in | Wt 116.2 lb

## 2023-08-21 DIAGNOSIS — M542 Cervicalgia: Secondary | ICD-10-CM

## 2023-08-21 DIAGNOSIS — R202 Paresthesia of skin: Secondary | ICD-10-CM

## 2023-08-21 DIAGNOSIS — M79609 Pain in unspecified limb: Secondary | ICD-10-CM | POA: Diagnosis not present

## 2023-08-21 DIAGNOSIS — M4722 Other spondylosis with radiculopathy, cervical region: Secondary | ICD-10-CM | POA: Diagnosis not present

## 2023-08-21 DIAGNOSIS — Z1159 Encounter for screening for other viral diseases: Secondary | ICD-10-CM

## 2023-08-21 DIAGNOSIS — F419 Anxiety disorder, unspecified: Secondary | ICD-10-CM | POA: Diagnosis not present

## 2023-08-21 LAB — TSH: TSH: 2.69 u[IU]/mL (ref 0.35–5.50)
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
## 2023-08-21 NOTE — Assessment & Plan Note (Signed)
Could be MSK however she is having radiculopathy like symptoms down her left arm. Will send to neurology for EMG and obtain x-rays of the neck.

## 2023-08-21 NOTE — Assessment & Plan Note (Signed)
History of, will check TSH to rule out hyperthyroidism. Pt reports she has lost weight since her hysterectomy.

## 2023-08-22 ENCOUNTER — Encounter: Payer: Self-pay | Admitting: Family Medicine

## 2023-08-22 ENCOUNTER — Telehealth: Payer: Self-pay | Admitting: *Deleted

## 2023-08-22 LAB — HEPATITIS C ANTIBODY: Hepatitis C Ab: NONREACTIVE

## 2023-08-22 NOTE — Telephone Encounter (Signed)
-----   Message from [place] sent at 08/21/2023 12:54 PM EDT ----- Please abstract and route to provider

## 2023-08-22 NOTE — Telephone Encounter (Signed)
Abstracted

## 2023-10-15 ENCOUNTER — Ambulatory Visit: Payer: Medicaid Other | Admitting: Family Medicine

## 2023-12-11 ENCOUNTER — Encounter: Payer: Self-pay | Admitting: Diagnostic Neuroimaging

## 2023-12-11 ENCOUNTER — Ambulatory Visit: Payer: Medicaid Other | Admitting: Diagnostic Neuroimaging

## 2023-12-11 VITALS — BP 109/66 | HR 69 | Ht 63.0 in | Wt 116.0 lb

## 2023-12-11 DIAGNOSIS — M79602 Pain in left arm: Secondary | ICD-10-CM | POA: Diagnosis not present

## 2023-12-11 DIAGNOSIS — M5412 Radiculopathy, cervical region: Secondary | ICD-10-CM

## 2023-12-11 DIAGNOSIS — M542 Cervicalgia: Secondary | ICD-10-CM
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
## 2023-12-17 ENCOUNTER — Telehealth: Payer: Self-pay | Admitting: Diagnostic Neuroimaging

## 2023-12-17 NOTE — Telephone Encounter (Signed)
[facility] [place]: [id number] exp. 12/17/23-02/14/24 sent to GI [phone]

## 2024-05-07 DIAGNOSIS — Z1272 Encounter for screening for malignant neoplasm of vagina: Secondary | ICD-10-CM | POA: Diagnosis not present

## 2024-05-07 DIAGNOSIS — Z0000 Encounter for general adult medical examination without abnormal findings: Secondary | ICD-10-CM | POA: Diagnosis not present
